# Patient Record
Sex: Male | Born: 1990 | Race: Black or African American | Hispanic: No | Marital: Single | Smoking: Current every day smoker
Health system: Southern US, Community
[De-identification: ages and names within clinical notes are randomized; demographics above are authoritative.]

## PROBLEM LIST (undated history)

## (undated) DIAGNOSIS — G43909 Migraine, unspecified, not intractable, without status migrainosus: Secondary | ICD-10-CM

## (undated) HISTORY — PX: NO PAST SURGERIES: SHX2092

---

## 2015-02-17 ENCOUNTER — Emergency Department
Admission: EM | Admit: 2015-02-17 | Discharge: 2015-02-17 | Disposition: A | Discharge status: Home / Self Care | Payer: Self-pay | Attending: Emergency Medicine | Admitting: Emergency Medicine

## 2015-02-17 ENCOUNTER — Encounter: Payer: Self-pay | Admitting: Emergency Medicine

## 2015-02-17 DIAGNOSIS — H109 Unspecified conjunctivitis: Secondary | ICD-10-CM | POA: Insufficient documentation

## 2015-02-17 DIAGNOSIS — F17210 Nicotine dependence, cigarettes, uncomplicated: Secondary | ICD-10-CM | POA: Insufficient documentation

## 2015-02-17 MED ORDER — CIPROFLOXACIN HCL 0.3 % OP SOLN: 1.0000 [drp] | OPHTHALMIC | Status: AC

## 2015-02-17 MED ORDER — CIPROFLOXACIN HCL 0.3 % OP SOLN: 1.0000 [drp] | OPHTHALMIC | Status: DC

## 2015-02-17 NOTE — ED Provider Notes (Signed)
[facility] Emergency Department Provider Note ____________________________________________  Time seen: Approximately 1:11 PM  I have reviewed the triage vital signs and the nursing notes.   HISTORY  Chief Complaint Eye Pain   HPI Chase Young is a 23 y.o. male who presents to the emergency department for evaluation of right eye irritation and pain. He reports that the symptoms started last night. He awakened this morning and the right eye was matted shut. He has severe light sensitivity today with a watery drainage and mucus in the lower lid. No known exposure to conjunctivitis.He denies injury.   History reviewed. No pertinent past medical history.  There are no active problems to display for this patient.   History reviewed. No pertinent past surgical history.  Current Outpatient Rx  Name  Route  Sig  Dispense  Refill  . ciprofloxacin (CILOXAN) 0.3 % ophthalmic solution   Right Eye   Place 1 drop into the right eye every 2 (two) hours. Administer 1 drop, every 2 hours, while awake, for 2 days. Then 1 drop, every 4 hours, while awake, for the next 5 days.   5 mL   0     Allergies Review of patient's allergies indicates no known allergies.  No family history on file.  Social History Social History  Substance Use Topics  . Smoking status: Current Every Day Smoker -- 0.50 packs/day    Types: Cigarettes  . Smokeless tobacco: None  . Alcohol Use: 0.6 oz/week    1 Cans of beer per week    Review of Systems   Constitutional: No fever/chills Eyes: Visual changes: Difficult to determine due to photosensitivity.. ENT: No sore throat. Cardiovascular: Denies chest pain. Respiratory: Denies shortness of breath. Gastrointestinal: No abdominal pain.  No nausea, no vomiting.  No diarrhea.  No constipation. Musculoskeletal: Negative for pain. Skin: Negative for rash. Neurological: Negative for headaches, focal weakness or  numbness. Psychiatric:At baseline, no complaint Lymphatic:Swollen nodes-- no Allergic: Seasonal allergies: no 10-point ROS otherwise negative.  ____________________________________________  PHYSICAL EXAM:  VITAL SIGNS: ED Triage Vitals  Enc Vitals Group     BP 02/17/15 1227 135/107 mmHg     Pulse Rate 02/17/15 1227 73     Resp 02/17/15 1227 18     Temp 02/17/15 1227 97.3 F (36.3 C)     Temp Source 02/17/15 1227 Oral     SpO2 02/17/15 1227 98 %     Weight 02/17/15 1227 160 lb (72.576 kg)     Height 02/17/15 1227  (1.676 m)     Head Cir --      Peak Flow --      Pain Score 02/17/15 1228 7     Pain Loc --      Pain Edu? --      Excl. in GC? --     Constitutional: Alert and oriented. Well appearing and in no acute distress. Eyes: Visual acuity--see nursing documentation; No globe trauma; Eyelids normal to inspection; Everted for exam no; Conjunctiva and sclera: Erythematous and injected; Corneas: Normal on  Non-stained exam; Examined with fluorescein no; EOM's intact; Pupils PERRLA; Anterior Chambers normal with limited exam.  Head: Atraumatic. Nose: No congestion/rhinnorhea. Mouth/Throat: Mucous membranes are moist.  Oropharynx non-erythematous. Neck: No stridor.  Cardiovascular: Normal rate, regular rhythm. Grossly normal heart sounds.  Good peripheral circulation. Respiratory: Normal respiratory effort.  No retractions. Gastrointestinal: Soft and nontender. No distention. No abdominal bruits. No CVA tenderness. Musculoskeletal:Normal ROM Neurologic:  Normal speech and  language. No gross focal neurologic deficits are appreciated. Speech is normal. No gait instability. Skin:  Skin is warm, dry and intact. No rash noted. Psychiatric: Mood and affect are normal. Speech and behavior are normal.  ____________________________________________   LABS (all labs ordered are listed, but only abnormal results are displayed)  Labs Reviewed - No data to  display ____________________________________________  EKG   ____________________________________________  RADIOLOGY  Not indicated ____________________________________________   PROCEDURES  Procedure(s) performed: None  ____________________________________________   INITIAL IMPRESSION / ASSESSMENT AND PLAN / ED COURSE  Pertinent labs & imaging results that were available during my care of the patient were reviewed by me and considered in my medical decision making (see chart for details).   Patient was advised to follow up with ophthalmology for symptoms that are not improving over the next 2 days. He was  also advised to return to the ER for symptoms that change or worsen if unable to schedule an appointment.  ____________________________________________   FINAL CLINICAL IMPRESSION(S) / ED DIAGNOSES  Final diagnoses:  Conjunctivitis of right eye       Chinita PesterCari B Suzie Vandam, FNP 02/17/15 1326  Sharyn CreamerMark Quale, MD 02/17/15 [phone]

## 2015-02-17 NOTE — Discharge Instructions (Signed)

## 2015-02-17 NOTE — ED Notes (Signed)
Patient to ED with R eye pain, redness, tearing & sensitivity to light.  Patient reports thick mucous coming out of eye and crustiness.  Pt in no other signs of distress.

## 2015-05-20 ENCOUNTER — Emergency Department: Payer: Self-pay

## 2015-05-20 ENCOUNTER — Emergency Department
Admission: EM | Admit: 2015-05-20 | Discharge: 2015-05-20 | Disposition: A | Discharge status: Home / Self Care | Payer: Self-pay | Attending: Emergency Medicine | Admitting: Emergency Medicine

## 2015-05-20 ENCOUNTER — Encounter: Payer: Self-pay | Admitting: Emergency Medicine

## 2015-05-20 DIAGNOSIS — G8921 Chronic pain due to trauma: Secondary | ICD-10-CM | POA: Insufficient documentation

## 2015-05-20 DIAGNOSIS — M722 Plantar fascial fibromatosis: Secondary | ICD-10-CM | POA: Insufficient documentation

## 2015-05-20 DIAGNOSIS — Y9289 Other specified places as the place of occurrence of the external cause: Secondary | ICD-10-CM | POA: Insufficient documentation

## 2015-05-20 DIAGNOSIS — S3991XA Unspecified injury of abdomen, initial encounter: Secondary | ICD-10-CM | POA: Insufficient documentation

## 2015-05-20 DIAGNOSIS — M79641 Pain in right hand: Secondary | ICD-10-CM | POA: Insufficient documentation

## 2015-05-20 DIAGNOSIS — Q6650 Congenital pes planus, unspecified foot: Secondary | ICD-10-CM | POA: Insufficient documentation

## 2015-05-20 DIAGNOSIS — F17210 Nicotine dependence, cigarettes, uncomplicated: Secondary | ICD-10-CM | POA: Insufficient documentation

## 2015-05-20 DIAGNOSIS — Y9389 Activity, other specified: Secondary | ICD-10-CM | POA: Insufficient documentation

## 2015-05-20 DIAGNOSIS — X501XXA Overexertion from prolonged static or awkward postures, initial encounter: Secondary | ICD-10-CM | POA: Insufficient documentation

## 2015-05-20 DIAGNOSIS — S39012A Strain of muscle, fascia and tendon of lower back, initial encounter: Secondary | ICD-10-CM | POA: Insufficient documentation

## 2015-05-20 DIAGNOSIS — Y998 Other external cause status: Secondary | ICD-10-CM | POA: Insufficient documentation

## 2015-05-20 MED ORDER — NAPROXEN 500 MG PO TABS: 500.0000 mg | ORAL_TABLET | Freq: Two times a day (BID) | ORAL | Status: DC

## 2015-05-20 MED ORDER — CYCLOBENZAPRINE HCL 10 MG PO TABS: 10.0000 mg | ORAL_TABLET | Freq: Three times a day (TID) | ORAL | Status: DC | PRN

## 2015-05-20 NOTE — ED Notes (Signed)
States he developed pain to bottom of left foot last Tuesday  W/o injury  Min relief with Ibuprofen ..ambulates well no swelling.. Then noticed some muscle discomfort to right lower back yesterday

## 2015-05-20 NOTE — ED Provider Notes (Signed)
[facility] Emergency Department Provider Note  ____________________________________________  Time seen: Approximately 12:35 PM  I have reviewed the triage vital signs and the nursing notes.   HISTORY  Chief Complaint Hand Pain and Foot Pain    HPI Chase Young is a 25 y.o. male patient complaining of left plan of foot pain for 6 days. Patient also complaining of muscle discomfort and third right paraspinal area of his back. Patient also complaining of right hand pain secondary to injury 2 years ago. Patient state he never had a hand x-ray and it feels tight when flexion the third phalange. Patient denies any recent trauma, decreased sensation or function of the right hand. Patient say his right flank pain secondary to a twisting incident occurred 2 days ago. No palliative measures taken for these complaints. Patient chief complaint this is left foot and he rates this pain as a 5/10. Patient stated pain increases ambulation or prolonged standing.   History reviewed. No pertinent past medical history.  There are no active problems to display for this patient.   History reviewed. No pertinent past surgical history.  Current Outpatient Rx  Name  Route  Sig  Dispense  Refill  . cyclobenzaprine (FLEXERIL) 10 MG tablet   Oral   Take 1 tablet (10 mg total) by mouth every 8 (eight) hours as needed for muscle spasms.   15 tablet   0   . naproxen (NAPROSYN) 500 MG tablet   Oral   Take 1 tablet (500 mg total) by mouth 2 (two) times daily with a meal.   60 tablet   0     Allergies Review of patient's allergies indicates no known allergies.  No family history on file.  Social History Social History  Substance Use Topics  . Smoking status: Current Every Day Smoker -- 0.50 packs/day    Types: Cigarettes  . Smokeless tobacco: None  . Alcohol Use: 0.6 oz/week    1 Cans of beer per week    Review of Systems Constitutional: No fever/chills Eyes: No  visual changes. ENT: No sore throat. Cardiovascular: Denies chest pain. Respiratory: Denies shortness of breath. Gastrointestinal: No abdominal pain.  No nausea, no vomiting.  No diarrhea.  No constipation. Genitourinary: Negative for dysuria. Musculoskeletal: Negative for back pain. Skin: Negative for rash. Neurological: Negative for headaches, focal weakness or numbness.    ____________________________________________   PHYSICAL EXAM:  VITAL SIGNS: ED Triage Vitals  Enc Vitals Group     BP 05/20/15 1150 133/63 mmHg     Pulse Rate 05/20/15 1150 83     Resp 05/20/15 1150 16     Temp 05/20/15 1150 98.4 F (36.9 C)     Temp Source 05/20/15 1150 Oral     SpO2 05/20/15 1150 99 %     Weight 05/20/15 1150 172 lb (78.019 kg)     Height 05/20/15 1150 5\' 6"  (1.676 m)     Head Cir --      Peak Flow --      Pain Score 05/20/15 1146 5     Pain Loc --      Pain Edu? --      Excl. in GC? --     Constitutional: Alert and oriented. Well appearing and in no acute distress. Eyes: Conjunctivae are normal. PERRL. EOMI. Head: Atraumatic. Nose: No congestion/rhinnorhea. Mouth/Throat: Mucous membranes are moist.  Oropharynx non-erythematous. Neck: No stridor.  No cervical spine tenderness to palpation. Hematological/Lymphatic/Immunilogical: No cervical lymphadenopathy. Cardiovascular: Normal rate,  regular rhythm. Grossly normal heart sounds.  Good peripheral circulation. Respiratory: Normal respiratory effort.  No retractions. Lungs CTAB. Gastrointestinal: Soft and nontender. No distention. No abdominal bruits. No CVA tenderness. Musculoskeletal: No lower extremity tenderness nor edema.  No joint effusions. Bilateral pes planus Neurologic:  Normal speech and language. No gross focal neurologic deficits are appreciated. No gait instability. Skin:  Skin is warm, dry and intact. No rash noted. Psychiatric: Mood and affect are normal. Speech and behavior are  normal.  ____________________________________________   LABS (all labs ordered are listed, but only abnormal results are displayed)  Labs Reviewed - No data to display ____________________________________________  EKG   ____________________________________________  RADIOLOGY  No acute findings on x-ray ____________________________________________   PROCEDURES  Procedure(s) performed: None  Critical Care performed: No  ____________________________________________   INITIAL IMPRESSION / ASSESSMENT AND PLAN / ED COURSE  Pertinent labs & imaging results that were available during my care of the patient were reviewed by me and considered in my medical decision making (see chart for details).  Fasciitis secondary to pes planus. Discussed x-ray findings with patient. Advised patient to follow-up with podiatry for definitive treatment. Patient given a prescription for naproxen. Second diagnosis right paraspinal muscles sprain. Patient given a prescription for Flexeril. ____________________________________________   FINAL CLINICAL IMPRESSION(S) / ED DIAGNOSES  Final diagnoses:  Plantar fasciitis of left foot  Pes planus, congenital, unspecified laterality  Strain of lumbar paraspinal muscle, initial encounter      Joni Reining, PA-C 05/20/15 1357  Rockne Menghini, MD 05/20/15 1535

## 2015-05-20 NOTE — ED Notes (Signed)
C/o right hand pain and left foot pain and right lower back pain.  States it has been going on for a long while.  NAD.

## 2015-05-23 ENCOUNTER — Emergency Department
Admission: EM | Admit: 2015-05-23 | Discharge: 2015-05-23 | Disposition: A | Discharge status: Home / Self Care | Payer: Self-pay | Attending: Emergency Medicine | Admitting: Emergency Medicine

## 2015-05-23 ENCOUNTER — Encounter: Payer: Self-pay | Admitting: Emergency Medicine

## 2015-05-23 DIAGNOSIS — M722 Plantar fascial fibromatosis: Secondary | ICD-10-CM | POA: Insufficient documentation

## 2015-05-23 DIAGNOSIS — Z791 Long term (current) use of non-steroidal anti-inflammatories (NSAID): Secondary | ICD-10-CM | POA: Insufficient documentation

## 2015-05-23 DIAGNOSIS — F17210 Nicotine dependence, cigarettes, uncomplicated: Secondary | ICD-10-CM | POA: Insufficient documentation

## 2015-05-23 NOTE — ED Notes (Signed)
Presents with pain to arch of left foot   Denies recent injury  Ambulates well to treatment toom

## 2015-05-23 NOTE — ED Notes (Addendum)
States he was seen for same about 3 days ago.  States he wants to be rechecked   conts to have pain. Presents with ace wrap to foot  Ambulates well to treatment room

## 2015-05-23 NOTE — Discharge Instructions (Signed)
Plantar Fasciitis Plantar fasciitis is a painful foot condition that affects the heel. It occurs when the band of tissue that connects the toes to the heel bone (plantar fascia) becomes irritated. This can happen after exercising too much or doing other repetitive activities (overuse injury). The pain from plantar fasciitis can range from mild irritation to severe pain that makes it difficult for you to walk or move. The pain is usually worse in the morning or after you have been sitting or lying down for a while. CAUSES This condition may be caused by:  Standing for long periods of time.  Wearing shoes that do not fit.  Doing high-impact activities, including running, aerobics, and ballet.  Being overweight.  Having an abnormal way of walking (gait).  Having tight calf muscles.  Having high arches in your feet.  Starting a new athletic activity. SYMPTOMS The main symptom of this condition is heel pain. Other symptoms include:  Pain that gets worse after activity or exercise.  Pain that is worse in the morning or after resting.  Pain that goes away after you walk for a few minutes. DIAGNOSIS This condition may be diagnosed based on your signs and symptoms. Your health care provider will also do a physical exam to check for:  A tender area on the bottom of your foot.  A high arch in your foot.  Pain when you move your foot.  Difficulty moving your foot. You may also need to have imaging studies to confirm the diagnosis. These can include:  X-rays.  Ultrasound.  MRI. TREATMENT  Treatment for plantar fasciitis depends on the severity of the condition. Your treatment may include:  Rest, ice, and over-the-counter pain medicines to manage your pain.  Exercises to stretch your calves and your plantar fascia.  A splint that holds your foot in a stretched, upward position while you sleep (night splint).  Physical therapy to relieve symptoms and prevent problems in the  future.  Cortisone injections to relieve severe pain.  Extracorporeal shock wave therapy (ESWT) to stimulate damaged plantar fascia with electrical impulses. It is often used as a last resort before surgery.  Surgery, if other treatments have not worked after 12 months. HOME CARE INSTRUCTIONS  Take medicines only as directed by your health care provider.  Avoid activities that cause pain.  Roll the bottom of your foot over a bag of ice or a bottle of cold water. Do this for 20 minutes, 3-4 times a day.  Perform simple stretches as directed by your health care provider.  Try wearing athletic shoes with air-sole or gel-sole cushions or soft shoe inserts.  Wear a night splint while sleeping, if directed by your health care provider.  Keep all follow-up appointments with your health care provider. PREVENTION   Do not perform exercises or activities that cause heel pain.  Consider finding low-impact activities if you continue to have problems.  Lose weight if you need to. The best way to prevent plantar fasciitis is to avoid the activities that aggravate your plantar fascia. SEEK MEDICAL CARE IF:  Your symptoms do not go away after treatment with home care measures.  Your pain gets worse.  Your pain affects your ability to move or do your daily activities.   This information is not intended to replace advice given to you by your health care provider. Make sure you discuss any questions you have with your health care provider.   Document Released: 11/25/2000 Document Revised: 11/21/2014 Document Reviewed: 01/10/2014 Elsevier   Interactive Patient Education 2016 [employer]  

## 2015-05-23 NOTE — ED Provider Notes (Signed)
[facility] Emergency Department Provider Note  ____________________________________________  Time seen: Approximately 9:51 AM  I have reviewed the triage vital signs and the nursing notes.   HISTORY  Chief Complaint Foot Pain    HPI Chase Young is a 25 y.o. male , NAD, presents to the emergency department with continued left foot arch pain. Was seen in this emergency department 3 days ago for the same. Had negative x-rays and workup and was diagnosed with plantar fasciitis. States the pain is improving with medications prescribed as well as utilizing ice to the affected area. States he presents today to gain a work release note. Has had no injuries, traumas, falls. Denies any redness, swelling, warmth to the area.   History reviewed. No pertinent past medical history.  There are no active problems to display for this patient.   History reviewed. No pertinent past surgical history.  Current Outpatient Rx  Name  Route  Sig  Dispense  Refill  . cyclobenzaprine (FLEXERIL) 10 MG tablet   Oral   Take 1 tablet (10 mg total) by mouth every 8 (eight) hours as needed for muscle spasms.   15 tablet   0   . naproxen (NAPROSYN) 500 MG tablet   Oral   Take 1 tablet (500 mg total) by mouth 2 (two) times daily with a meal.   60 tablet   0     Allergies Review of patient's allergies indicates no known allergies.  No family history on file.  Social History Social History  Substance Use Topics  . Smoking status: Current Every Day Smoker -- 0.50 packs/day    Types: Cigarettes  . Smokeless tobacco: None  . Alcohol Use: 0.6 oz/week    1 Cans of beer per week     Review of Systems  Constitutional: No fever/chills Cardiovascular: No chest pain. Respiratory:  No shortness of breath. Musculoskeletal: Positive left foot arch pain.  Skin: Negative for rash, redness, swelling, skin sores. Neurological: Negative for headaches, focal weakness or  numbness. 10-point ROS otherwise negative.  ____________________________________________   PHYSICAL EXAM:  VITAL SIGNS: ED Triage Vitals  Enc Vitals Group     BP 05/23/15 0918 125/61 mmHg     Pulse Rate 05/23/15 0918 78     Resp 05/23/15 0918 20     Temp 05/23/15 0918 98.7 F (37.1 C)     Temp Source 05/23/15 0918 Oral     SpO2 05/23/15 0918 99 %     Weight 05/23/15 0918 169 lb (76.658 kg)     Height 05/23/15 0918 5\' 6"  (1.676 m)     Head Cir --      Peak Flow --      Pain Score --      Pain Loc --      Pain Edu? --      Excl. in GC? --     Constitutional: Alert and oriented. Well appearing and in no acute distress. Eyes: Conjunctivae are normal.  Head: Atraumatic. Cardiovascular: Good peripheral circulation. Respiratory: Normal respiratory effort without tachypnea or retractions.  Musculoskeletal: Tenderness about the plantar fascia of the left foot. Patient notes improvement since last visit. No lower extremity edema.  No joint effusions. Neurologic:  Normal speech and language. No gross focal neurologic deficits are appreciated.  Skin:  Skin is warm, dry and intact. No rash noted. Psychiatric: Mood and affect are normal. Speech and behavior are normal. Patient exhibits appropriate insight and judgement.   ____________________________________________   Vickie EpleyLABS  None  ____________________________________________  EKG  None ____________________________________________  RADIOLOGY  None  ____________________________________________    PROCEDURES  Procedure(s) performed: None    Medications - No data to display   ____________________________________________   INITIAL IMPRESSION / ASSESSMENT AND PLAN / ED COURSE  Patient's diagnosis is consistent with plantar fasciitis of the left foot. Patient advised to continue Naprosyn as previously prescribed. Continue ice to the affected area. Also advised patient to seek new insoles for his work boots to  provide better or support. Work note given to release patient back to work without restrictions as of Sunday, March 12 per the patient's request. Patient is to follow up with [facility] if symptoms persist past this treatment course. Patient is given ED precautions to return to the ED for any worsening or new symptoms.   ____________________________________________  FINAL CLINICAL IMPRESSION(S) / ED DIAGNOSES  Final diagnoses:  Plantar fasciitis of left foot      NEW MEDICATIONS STARTED DURING THIS VISIT:  New Prescriptions   No medications on file         Hope Pigeon, PA-C 05/23/15 1012  Governor Rooks, MD 05/23/15 1331

## 2015-11-06 ENCOUNTER — Encounter: Payer: Self-pay | Admitting: Medical Oncology

## 2015-11-06 ENCOUNTER — Emergency Department
Admission: EM | Admit: 2015-11-06 | Discharge: 2015-11-06 | Disposition: A | Discharge status: Home / Self Care | Payer: Self-pay | Attending: Student in an Organized Health Care Education/Training Program | Admitting: Student in an Organized Health Care Education/Training Program

## 2015-11-06 DIAGNOSIS — J029 Acute pharyngitis, unspecified: Secondary | ICD-10-CM | POA: Insufficient documentation

## 2015-11-06 DIAGNOSIS — Z791 Long term (current) use of non-steroidal anti-inflammatories (NSAID): Secondary | ICD-10-CM | POA: Insufficient documentation

## 2015-11-06 DIAGNOSIS — F17210 Nicotine dependence, cigarettes, uncomplicated: Secondary | ICD-10-CM | POA: Insufficient documentation

## 2015-11-06 LAB — POCT RAPID STREP A: Streptococcus, Group A Screen (Direct): NEGATIVE

## 2015-11-06 MED ORDER — MAGIC MOUTHWASH: ORAL | 0 refills | Status: DC

## 2015-11-06 MED ORDER — MAGIC MOUTHWASH: 5.0000 mL | Freq: Four times a day (QID) | ORAL | 0 refills | Status: DC

## 2015-11-06 NOTE — ED Provider Notes (Signed)
[facility] Emergency Department Provider Note  ____________________________________________   First MD Initiated Contact with Patient 11/06/15 1944     (approximate)  I have reviewed the triage vital signs and the nursing notes.   HISTORY  Chief Complaint Sore Throat   HPI Lyda PeroneMarcus D Feng is a 25 y.o. male serous complaint of sore throat that began yesterday.Patient states that he has had a fever and has been taking home remedies to reduce his fever without much relief. Patient has not actually taken his temperature. He states pain is increased with swallowing and talking. Patient denies any ear pain, nausea or vomiting. Currently he rates his pain as a 7 out of 10.    History reviewed. No pertinent past medical history.  There are no active problems to display for this patient.   History reviewed. No pertinent surgical history.  Prior to Admission medications   Medication Sig Start Date End Date Taking? Authorizing Provider  cyclobenzaprine (FLEXERIL) 10 MG tablet Take 1 tablet (10 mg total) by mouth every 8 (eight) hours as needed for muscle spasms. 05/20/15   Joni Reiningonald K Smith, PA-C  magic mouthwash SOLN Benadryl, alum & mag, nystation  5ml ac and hs, swish and swallow 11/06/15   Tommi Rumpshonda L Summers, PA-C  naproxen (NAPROSYN) 500 MG tablet Take 1 tablet (500 mg total) by mouth 2 (two) times daily with a meal. 05/20/15 05/19/16  Joni Reiningonald K Smith, PA-C    Allergies Review of patient's allergies indicates no known allergies.  No family history on file.  Social History Social History  Substance Use Topics  . Smoking status: Current Every Day Smoker    Packs/day: 0.50    Types: Cigarettes  . Smokeless tobacco: Not on file  . Alcohol use 0.6 oz/week    1 Cans of beer per week    Review of Systems Constitutional: No fever/chills Eyes: No visual changes. ENT: Positive sore throat. Cardiovascular: Denies chest pain. Respiratory: Denies shortness of  breath. Gastrointestinal:   No nausea, no vomiting.  Skin: Negative for rash. Neurological: Negative for headaches, focal weakness or numbness.  10-point ROS otherwise negative.  ____________________________________________   PHYSICAL EXAM:  VITAL SIGNS: ED Triage Vitals  Enc Vitals Group     BP 11/06/15 1857 129/65     Pulse Rate 11/06/15 1857 92     Resp 11/06/15 1857 16     Temp 11/06/15 1857 97.8 F (36.6 C)     Temp Source 11/06/15 1857 Oral     SpO2 11/06/15 1857 99 %     Weight 11/06/15 1852 150 lb (68 kg)     Height 11/06/15 1852 5\' 6"  (1.676 m)     Head Circumference --      Peak Flow --      Pain Score 11/06/15 1852 7     Pain Loc --      Pain Edu? --      Excl. in GC? --     Constitutional: Alert and oriented. Well appearing and in no acute distress. Eyes: Conjunctivae are normal. PERRL. EOMI. Head: Atraumatic. Nose: No congestion/rhinnorhea. Mouth/Throat: Mucous membranes are moist.  Oropharynx erythematous Without exudate. Neck: No stridor.   Hematological/Lymphatic/Immunilogical: Mild bilateral cervical lymphadenopathy. Cardiovascular: Normal rate, regular rhythm. Grossly normal heart sounds.  Good peripheral circulation. Respiratory: Normal respiratory effort.  No retractions. Lungs CTAB. Musculoskeletal: Moves upper and lower extremities without any difficulty. Normal gait was noted. Neurologic:  Normal speech and language. No gross focal neurologic deficits are  appreciated. No gait instability. Skin:  Skin is warm, dry and intact. No rash noted. Psychiatric: Mood and affect are normal. Speech and behavior are normal.  ____________________________________________   LABS (all labs ordered are listed, but only abnormal results are displayed)  Labs Reviewed  CULTURE, GROUP A STREP [facility]  POCT RAPID STREP A      PROCEDURES  Procedure(s) performed: None  Procedures  Critical Care performed:  No  ____________________________________________   INITIAL IMPRESSION / ASSESSMENT AND PLAN / ED COURSE  Pertinent labs & imaging results that were available during my care of the patient were reviewed by me and considered in my medical decision making (see chart for details).   Clinical Course   Patient was given information that his strep test was negative. He was given a prescription for Magic mouthwash and to take Tylenol as needed for throat pain. He is follow-up with [facility] if any continued problems with his throat.  ____________________________________________   FINAL CLINICAL IMPRESSION(S) / ED DIAGNOSES  Final diagnoses:  Viral pharyngitis      NEW MEDICATIONS STARTED DURING THIS VISIT:  Discharge Medication List as of 11/06/2015  8:21 PM    START taking these medications   Details  magic mouthwash SOLN Take 5 mLs by mouth 4 (four) times daily., Starting Wed 11/06/2015, Print         Note:  This document was prepared using Dragon voice recognition software and may include unintentional dictation errors.    Tommi RumpsRhonda L Summers, PA-C 11/06/15 2127    Willy EddyPatrick Robinson, MD 11/06/15 [phone]

## 2015-11-06 NOTE — Discharge Instructions (Signed)
Use Magic mouthwash as directed. Tylenol as needed for throat pain. Follow-up with [facility] if any continued problems with your throat.

## 2015-11-06 NOTE — ED Triage Notes (Signed)
Pt c/o sore throat since yesterday 

## 2015-11-06 NOTE — ED Notes (Signed)
POC rapid strepA negative.

## 2015-11-09 LAB — CULTURE, GROUP A STREP (THRC)

## 2015-11-28 ENCOUNTER — Encounter: Payer: Self-pay | Admitting: Emergency Medicine

## 2015-11-28 ENCOUNTER — Emergency Department
Admission: EM | Admit: 2015-11-28 | Discharge: 2015-11-28 | Disposition: A | Discharge status: Home / Self Care | Payer: Self-pay | Attending: Emergency Medicine | Admitting: Emergency Medicine

## 2015-11-28 ENCOUNTER — Emergency Department: Payer: Self-pay

## 2015-11-28 DIAGNOSIS — E869 Volume depletion, unspecified: Secondary | ICD-10-CM | POA: Insufficient documentation

## 2015-11-28 DIAGNOSIS — Z87891 Personal history of nicotine dependence: Secondary | ICD-10-CM | POA: Insufficient documentation

## 2015-11-28 DIAGNOSIS — Z791 Long term (current) use of non-steroidal anti-inflammatories (NSAID): Secondary | ICD-10-CM | POA: Insufficient documentation

## 2015-11-28 DIAGNOSIS — R42 Dizziness and giddiness: Secondary | ICD-10-CM | POA: Insufficient documentation

## 2015-11-28 LAB — BASIC METABOLIC PANEL
ANION GAP: 7 (ref 5–15)
BUN: 12 mg/dL (ref 6–20)
CO2: 27 mmol/L (ref 22–32)
Calcium: 9.1 mg/dL (ref 8.9–10.3)
Chloride: 106 mmol/L (ref 101–111)
Creatinine, Ser: 1.25 mg/dL — ABNORMAL HIGH (ref 0.61–1.24)
Glucose, Bld: 99 mg/dL (ref 65–99)
POTASSIUM: 3.4 mmol/L — AB (ref 3.5–5.1)
SODIUM: 140 mmol/L (ref 135–145)

## 2015-11-28 LAB — CBC WITH DIFFERENTIAL/PLATELET
BASOS ABS: 0 10*3/uL (ref 0–0.1)
Basophils Relative: 1 %
EOS PCT: 2 %
Eosinophils Absolute: 0.1 10*3/uL (ref 0–0.7)
HCT: 41.5 % (ref 40.0–52.0)
Hemoglobin: 14.4 g/dL (ref 13.0–18.0)
LYMPHS ABS: 2.3 10*3/uL (ref 1.0–3.6)
LYMPHS PCT: 40 %
MCH: 30.1 pg (ref 26.0–34.0)
MCHC: 34.7 g/dL (ref 32.0–36.0)
MCV: 86.8 fL (ref 80.0–100.0)
Monocytes Absolute: 0.6 10*3/uL (ref 0.2–1.0)
Monocytes Relative: 10 %
NEUTROS ABS: 2.8 10*3/uL (ref 1.4–6.5)
Neutrophils Relative %: 47 %
PLATELETS: 227 10*3/uL (ref 150–440)
RBC: 4.78 MIL/uL (ref 4.40–5.90)
RDW: 13.8 % (ref 11.5–14.5)
WBC: 5.8 10*3/uL (ref 3.8–10.6)

## 2015-11-28 NOTE — ED Provider Notes (Signed)
[facility] Emergency Department Provider Note  ____________________________________________   First MD Initiated Contact with Patient 11/28/15 2131     (approximate)  I have reviewed the triage vital signs and the nursing notes.   HISTORY  Chief Complaint Dizziness and Shortness of Breath    HPI Chase Young is a 25 y.o. male who presents for evaluation of episodic lightheadedness, some mild shortness of breath, and generalized fatigue it is been gradual onset over the last several days.  He reports that he works very hard and has had maybe only 9 hours sleep total of this whole week.  His lightheadedness and dizziness occurs mostly at work.  He works in an area with a lot of fumes and he has to use a respirator and he also feels lightheaded when he does not use the respirator.  He has had a mildheadache at times but that is completely resolved.  Occasionally feels short of breath but that is resolved as well.  He has had no episodes of passing out.  He denies chest pain, nausea, vomiting, abdominal pain.  He states that he rarely eats a full meal and snacks regularly instead of eating at regular intervals.  He tries to stay hydrated but is not sure he has been doing so.    Overall he describes symptoms as moderate, better with rest and worse with exertion.  He does not have a primary care doctor so he just thought he should get checked out.   History reviewed. No pertinent past medical history.  There are no active problems to display for this patient.   History reviewed. No pertinent surgical history.  Prior to Admission medications   Medication Sig Start Date End Date Taking? Authorizing Provider  cyclobenzaprine (FLEXERIL) 10 MG tablet Take 1 tablet (10 mg total) by mouth every 8 (eight) hours as needed for muscle spasms. 05/20/15   Joni Reining, PA-C  magic mouthwash SOLN Benadryl, alum & mag, nystation  5ml ac and hs, swish and swallow 11/06/15    Tommi Rumps, PA-C  naproxen (NAPROSYN) 500 MG tablet Take 1 tablet (500 mg total) by mouth 2 (two) times daily with a meal. 05/20/15 05/19/16  Joni Reining, PA-C    Allergies Review of patient's allergies indicates no known allergies.  No family history on file.  Social History Social History  Substance Use Topics  . Smoking status: Former Smoker    Packs/day: 0.50    Types: Cigarettes    Quit date: 10/15/2015  . Smokeless tobacco: Never Used  . Alcohol use 0.6 oz/week    1 Cans of beer per week    Review of Systems Constitutional: No fever/chills Eyes: No visual changes. ENT: No sore throat. Cardiovascular: Denies chest pain. Respiratory: Denies shortness of breath. Gastrointestinal: No abdominal pain.  No nausea, no vomiting.  No diarrhea.  No constipation. Genitourinary: Negative for dysuria. Musculoskeletal: Negative for back pain. Skin: Negative for rash. Neurological: Negative for headaches, focal weakness or numbness.  10-point ROS otherwise negative.  ____________________________________________   PHYSICAL EXAM:  VITAL SIGNS: ED Triage Vitals  Enc Vitals Group     BP 11/28/15 1735 120/76     Pulse Rate 11/28/15 1735 72     Resp 11/28/15 1735 18     Temp 11/28/15 1735 98.2 F (36.8 C)     Temp Source 11/28/15 1735 Oral     SpO2 11/28/15 1735 99 %     Weight 11/28/15 1736 155 lb (  70.3 kg)     Height 11/28/15 1736 5\' 6"  (1.676 m)     Head Circumference --      Peak Flow --      Pain Score 11/28/15 1736 4     Pain Loc --      Pain Edu? --      Excl. in GC? --     Constitutional: Alert and oriented. Well appearing and in no acute distress. Eyes: Conjunctivae are normal. PERRL. EOMI. Head: Atraumatic. Nose: No congestion/rhinnorhea. Mouth/Throat: Mucous membranes are moist.  Oropharynx non-erythematous. Neck: No stridor.  No meningeal signs.   Cardiovascular: Normal rate, regular rhythm. Good peripheral circulation. Grossly normal heart  sounds. Respiratory: Normal respiratory effort.  No retractions. Lungs CTAB. Gastrointestinal: Soft and nontender. No distention.  Musculoskeletal: No lower extremity tenderness nor edema. No gross deformities of extremities. Neurologic:  Normal speech and language. No gross focal neurologic deficits are appreciated.  Skin:  Skin is warm, dry and intact. No rash noted. Psychiatric: Mood and affect are normal. Speech and behavior are normal.  ____________________________________________   LABS (all labs ordered are listed, but only abnormal results are displayed)  Labs Reviewed  BASIC METABOLIC PANEL - Abnormal; Notable for the following:       Result Value   Potassium 3.4 (*)    Creatinine, Ser 1.25 (*)    All other components within normal limits  CBC WITH DIFFERENTIAL/PLATELET   ____________________________________________  EKG  ED ECG REPORT I, Adryan Shin, the attending physician, personally viewed and interpreted this ECG.  Date: 11/28/2015 EKG Time: 17:47 Rate: 66 Rhythm: normal sinus rhythm with sinus arrhythmia QRS Axis: normal Intervals: normal ST/T Wave abnormalities: Mild anterior ST segment elevation consistent with early repolarization Conduction Disturbances: none Narrative Interpretation: unremarkable  ____________________________________________  RADIOLOGY   Dg Chest 2 View  Result Date: 11/28/2015 CLINICAL DATA:  Shortness of breath.  Cough since yesterday EXAM: CHEST  2 VIEW COMPARISON:  None. FINDINGS: The cardiomediastinal contours are normal. The lungs are clear. Pulmonary vasculature is normal. No consolidation, pleural effusion, or pneumothorax. No acute osseous abnormalities are seen. IMPRESSION: No acute pulmonary process. Electronically Signed   By: Rubye Oaks M.D.   On: 11/28/2015 18:21    ____________________________________________   PROCEDURES  Procedure(s) performed:   Procedures   Critical Care performed:  No ____________________________________________   INITIAL IMPRESSION / ASSESSMENT AND PLAN / ED COURSE  Pertinent labs & imaging results that were available during my care of the patient were reviewed by me and considered in my medical decision making (see chart for details).  Patient says his symptoms are most consistent with mild volume depletion.  His creatinine is 1.2 but his GFR is normal and he is drinking Powerade in the room.  I advised him to try to follow up with primary care doctor, eat and drink regularly and plan clear fluids, but explained that there does not seem to be an acute or emergent cause of his symptoms.  No evidence of Brugada or WPW on his EKG and he has had no syncopal episodes.  Overall his exam is reassuring and I think is appropriate for outpatient follow-up.  I gave my usual and customary return precautions.      ____________________________________________  FINAL CLINICAL IMPRESSION(S) / ED DIAGNOSES  Final diagnoses:  Episodic lightheadedness  Volume depletion     MEDICATIONS GIVEN DURING THIS VISIT:  Medications - No data to display   NEW OUTPATIENT MEDICATIONS STARTED DURING THIS VISIT:  New Prescriptions  No medications on file    Modified Medications   No medications on file    Discontinued Medications   No medications on file     Note:  This document was prepared using Dragon voice recognition software and may include unintentional dictation errors.    Loleta Roseory Wilhelm Ganaway, MD 11/28/15 2205

## 2015-11-28 NOTE — ED Notes (Addendum)
Pt reports mid-sternal and left rib pain that started yesterday. Pt states it went away for a little while and he "felt better" but the pain returned today and decided to come in for evaluation. 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 Pt reports (+) nausea, but denies vomiting.

## 2015-11-28 NOTE — Discharge Instructions (Signed)
You have been seen today in the Emergency Department (ED).  Your workup including labs and EKG show reassuring results.  Your symptoms may be due to dehydration, so it is important that you drink plenty of non-alcoholic fluids.  Please call your regular doctor as soon as possible to schedule the next available clinic appointment to follow up with him/her regarding your visit to the ED and your symptoms.  Return to the Emergency Department (ED)  if you have any further syncopal episodes (pass out again) or develop ANY chest pain, pressure, tightness, trouble breathing, sudden sweating, or other symptoms that concern you.

## 2015-11-28 NOTE — ED Triage Notes (Signed)
Pt states left work yesterday and had a hard time breathing, and became dizzy/drowsy. Pt states woke up last night diaphoretic with difficulty breathing and again this morning. 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 Pt states at this time he feels drowsy, is ambulatory without difficulty at this time, is alert and oriented at this time.

## 2015-12-26 ENCOUNTER — Encounter (HOSPITAL_COMMUNITY): Payer: Self-pay | Admitting: Emergency Medicine

## 2015-12-26 DIAGNOSIS — Y990 Civilian activity done for income or pay: Secondary | ICD-10-CM | POA: Insufficient documentation

## 2015-12-26 DIAGNOSIS — X58XXXA Exposure to other specified factors, initial encounter: Secondary | ICD-10-CM | POA: Insufficient documentation

## 2015-12-26 DIAGNOSIS — Z87891 Personal history of nicotine dependence: Secondary | ICD-10-CM | POA: Insufficient documentation

## 2015-12-26 DIAGNOSIS — R42 Dizziness and giddiness: Secondary | ICD-10-CM | POA: Insufficient documentation

## 2015-12-26 DIAGNOSIS — S161XXA Strain of muscle, fascia and tendon at neck level, initial encounter: Secondary | ICD-10-CM | POA: Insufficient documentation

## 2015-12-26 DIAGNOSIS — Y9389 Activity, other specified: Secondary | ICD-10-CM | POA: Insufficient documentation

## 2015-12-26 DIAGNOSIS — Y9289 Other specified places as the place of occurrence of the external cause: Secondary | ICD-10-CM | POA: Insufficient documentation

## 2015-12-26 LAB — CBC WITH DIFFERENTIAL/PLATELET
BASOS PCT: 1 %
Basophils Absolute: 0 10*3/uL (ref 0.0–0.1)
EOS ABS: 0.3 10*3/uL (ref 0.0–0.7)
EOS PCT: 5 %
HCT: 42.3 % (ref 39.0–52.0)
Hemoglobin: 14 g/dL (ref 13.0–17.0)
Lymphocytes Relative: 50 %
Lymphs Abs: 2.8 10*3/uL (ref 0.7–4.0)
MCH: 29.4 pg (ref 26.0–34.0)
MCHC: 33.1 g/dL (ref 30.0–36.0)
MCV: 88.7 fL (ref 78.0–100.0)
MONO ABS: 0.5 10*3/uL (ref 0.1–1.0)
MONOS PCT: 10 %
Neutro Abs: 1.8 10*3/uL (ref 1.7–7.7)
Neutrophils Relative %: 34 %
Platelets: 231 10*3/uL (ref 150–400)
RBC: 4.77 MIL/uL (ref 4.22–5.81)
RDW: 14.4 % (ref 11.5–15.5)
WBC: 5.4 10*3/uL (ref 4.0–10.5)

## 2015-12-26 LAB — COMPREHENSIVE METABOLIC PANEL
ALBUMIN: 3.7 g/dL (ref 3.5–5.0)
ALT: 38 U/L (ref 17–63)
ANION GAP: 9 (ref 5–15)
AST: 76 U/L — ABNORMAL HIGH (ref 15–41)
Alkaline Phosphatase: 57 U/L (ref 38–126)
BILIRUBIN TOTAL: 0.5 mg/dL (ref 0.3–1.2)
BUN: 12 mg/dL (ref 6–20)
CO2: 22 mmol/L (ref 22–32)
Calcium: 9 mg/dL (ref 8.9–10.3)
Chloride: 107 mmol/L (ref 101–111)
Creatinine, Ser: 1.15 mg/dL (ref 0.61–1.24)
GFR calc Af Amer: >60 mL/min (ref >60)
GFR calc non Af Amer: >60 mL/min (ref >60)
GLUCOSE: 98 mg/dL (ref 65–99)
POTASSIUM: 3.5 mmol/L (ref 3.5–5.1)
SODIUM: 138 mmol/L (ref 135–145)
TOTAL PROTEIN: 7 g/dL (ref 6.5–8.1)

## 2015-12-26 NOTE — ED Triage Notes (Signed)
Pt. reports feeling lightheaded , drowsy " almost passed out" / fatigue at work yesterday , pt. added muscle strain at posterior neck while working out last week .

## 2015-12-27 ENCOUNTER — Emergency Department (HOSPITAL_COMMUNITY)
Admission: EM | Admit: 2015-12-27 | Discharge: 2015-12-27 | Disposition: A | Discharge status: Home / Self Care | Payer: Self-pay | Attending: Emergency Medicine | Admitting: Emergency Medicine

## 2015-12-27 DIAGNOSIS — R42 Dizziness and giddiness: Secondary | ICD-10-CM

## 2015-12-27 DIAGNOSIS — S161XXA Strain of muscle, fascia and tendon at neck level, initial encounter: Secondary | ICD-10-CM

## 2015-12-27 LAB — I-STAT TROPONIN, ED: TROPONIN I, POC: 0.01 ng/mL (ref 0.00–0.08)

## 2015-12-27 LAB — D-DIMER, QUANTITATIVE: D-Dimer, Quant: 0.48 ug/mL-FEU (ref 0.00–0.50)

## 2015-12-27 MED ORDER — SODIUM CHLORIDE 0.9 % IV BOLUS (SEPSIS)
1000.0000 mL | Freq: Once | INTRAVENOUS | Status: AC
  Administered 2015-12-27: 1000 mL via INTRAVENOUS

## 2015-12-27 MED ORDER — KETOROLAC TROMETHAMINE 30 MG/ML IJ SOLN
30.0000 mg | Freq: Once | INTRAMUSCULAR | Status: AC
  Administered 2015-12-27: 30 mg via INTRAVENOUS
  Filled 2015-12-27: qty 1

## 2015-12-27 MED ORDER — IBUPROFEN 800 MG PO TABS: 800.0000 mg | ORAL_TABLET | Freq: Three times a day (TID) | ORAL | 0 refills | Status: DC | PRN

## 2015-12-27 NOTE — ED Notes (Signed)
MD at bedside. 

## 2015-12-27 NOTE — ED Notes (Signed)
Patient able to ambulate independently  

## 2015-12-27 NOTE — Discharge Instructions (Signed)
I recommend you increase your water intake. Please avoid caffeine and alcohol as these may make you more dehydrated. I recommended you use your respirator at all times when at work when exposed to fumes, chemicals. Please schedule appointment for close follow-up with a primary care physician. Your labs, EKG, vital signs today were normal.   To find a primary care or specialty doctor please call [phone] or [phone] to access "[facility] Find a Doctor Service."  You may also go on the [facility] website at [email or web address]  There are also multiple [facility] practices throughout the [place] that are frequently accepting new patients. You may find a clinic that is close to your home and contact them.  [facility] -  [address] [place] [phone]  [facility] in [place] (also locations in [place] and [place]) -  [address] [place] [address] [phone]  [facility] -  [address] [place] [address] [phone]

## 2015-12-27 NOTE — ED Provider Notes (Signed)
By signing my name below, I, Majel Homereyton Lee, attest that this documentation has been prepared under the direction and in the presence of Marija Calamari N Elisheba Mcdonnell, DO . Electronically Signed: Majel HomerPeyton Lee, Scribe. 12/27/2015. 1:02 AM.  TIME SEEN: 12:56 AM  CHIEF COMPLAINT: Lightheadedness   HPI:   Chase Young is a 25 y.o. male who presents to the Emergency Department for an evaluation s/p "blacking out" while at work yesterday. Pt reports he did not fall or lose consciousness during this episode but felt as if he was unaware of his surroundings and had to "come to" for several seconds. He states associated headache, shortness of breath, drowsiness and lightheadedness today. He denies a headache now in the ED but states he has been short of breath. Pt notes he began a new job 7 months ago and is exposed to "harsh" chemicals daily. He is not sure what these chemicals are but states he does have to wear a respirator because of the fumes. He believes his symptoms may be due to this as well as increased stress and lack of sleep recently. Pt also complains of posterior neck pain that began last week after working out. He believes he may have a muscle strain. He states he has not taken any medication to relieve his pain. Pt denies fever, chills, nausea, vomiting, cough and hx of PE in his lungs or legs. Pt reports he drove to [place] 1 month ago in which he was in the car for ~7 hours without stopping. [facility] on September 14 for the same. At that time had a negative workup including normal labs, chest x-ray and EKG that showed early repolarization.  ROS: See HPI Constitutional: no fever  Eyes: no drainage  ENT: no runny nose   Cardiovascular:  no chest pain  Resp: SOB  GI: no vomiting GU: no dysuria Integumentary: no rash  Allergy: no hives  Musculoskeletal: no leg swelling  Neurological: no slurred speech ROS otherwise negative  PAST MEDICAL HISTORY/PAST  SURGICAL HISTORY:  History reviewed. No pertinent past medical history.  MEDICATIONS:  Prior to Admission medications   Medication Sig Start Date End Date Taking? Authorizing Provider  cyclobenzaprine (FLEXERIL) 10 MG tablet Take 1 tablet (10 mg total) by mouth every 8 (eight) hours as needed for muscle spasms. 05/20/15   Joni Reiningonald K Smith, PA-C  magic mouthwash SOLN Benadryl, alum & mag, nystation  5ml ac and hs, swish and swallow 11/06/15   Tommi Rumpshonda L Summers, PA-C  naproxen (NAPROSYN) 500 MG tablet Take 1 tablet (500 mg total) by mouth 2 (two) times daily with a meal. 05/20/15 05/19/16  Joni Reiningonald K Smith, PA-C    ALLERGIES:  No Known Allergies  SOCIAL HISTORY:  Social History  Substance Use Topics  . Smoking status: Former Smoker    Packs/day: 0.50    Types: Cigarettes    Quit date: 10/15/2015  . Smokeless tobacco: Never Used  . Alcohol use Yes    FAMILY HISTORY: No family history on file.  EXAM: BP 119/79 (BP Location: Right Arm)   Pulse (!) 54   Temp 97.5 F (36.4 C)   Resp 18   SpO2 100%  CONSTITUTIONAL: Alert and oriented and responds appropriately to questions. Well-appearing; well-nourished HEAD: Normocephalic EYES: Conjunctivae clear, PERRL ENT: normal nose; no rhinorrhea; moist mucous membranes NECK: Supple, no meningismus, no LAD; no midline spinal tenderness or step-off or deformity, tender to palpation over the left trapezius muscle with associated muscle spasming  CARD: RRR; S1 and S2 appreciated; no murmurs, no clicks, no rubs, no gallops RESP: Normal chest excursion without splinting or tachypnea; breath sounds clear and equal bilaterally; no wheezes, no rhonchi, no rales, no hypoxia or respiratory distress, speaking full sentences ABD/GI: Normal bowel sounds; non-distended; soft, non-tender, no rebound, no guarding, no peritoneal signs BACK:  The back appears normal and is non-tender to palpation, there is no CVA tenderness EXT: Normal ROM in all joints; non-tender to  palpation; no edema; normal capillary refill; no cyanosis, no calf tenderness or swelling    SKIN: Normal color for age and race; warm; no rash NEURO: Moves all extremities equally, sensation to light touch intact diffusely, cranial nerves II through XII intact, strength 5/5 in all 4 extremities, ambulates with normal gait PSYCH: The patient's mood and manner are appropriate. Grooming and personal hygiene are appropriate.  MEDICAL DECISION MAKING: Patient here with complaints of intermittent headaches, drowsiness, shortness of breath and lightheadedness. Also complaining of posterior neck pain after working out.  Recently had negative workup at [facility] emergency department. EKG today shows early repolarization with no delta wave, WPW, arrhythmia, block. Doubt ACS, dissection. Does have risk factors for PE but risk is low. We'll obtain d-dimer. Labs ordered in triage are unremarkable. We'll check orthostatic vital signs. We'll treat his symptoms with Toradol and IV fluids.  ED PROGRESS: Troponin negative, d-dimer negative. He is not orthostatic. Reports feeling much better after IV fluids and Toradol. I feel patient is safe to be discharged home with close outpatient follow-up. Have advised him to use his respirator when exposed to chemicals at all times at work and discuss symptoms with his management as he is not sure of what chemicals he is exposed to. No sign of any life-threatening illness at this time. As for his neck pain a suspect this is muscle strain. Will discharge with prescription for ibuprofen given Toradol helps his pain the emergency. No midline tenderness or other injury that suggests he needs imaging of his neck. He is neurologically intact. Patient is comfortable with this plan.   At this time, I do not feel there is any life-threatening condition present. I have reviewed and discussed all results (EKG, imaging, lab, urine as appropriate), exam findings with patient/family. I  have reviewed nursing notes and appropriate previous records.  I feel the patient is safe to be discharged home without further emergent workup and can continue workup as an outpatient as needed. Discussed usual and customary return precautions. Patient/family verbalize understanding and are comfortable with this plan.  Outpatient follow-up has been provided. All questions have been answered.    EKG Interpretation  Date/Time:  Friday December 27 2015 01:12:31 EDT Ventricular Rate:  53 PR Interval:    QRS Duration: 94 QT Interval:  429 QTC Calculation: 403 R Axis:   68 Text Interpretation:  Sinus rhythm RSR' in V1 or V2, probably normal variant LVH by voltage ST elev, probable normal early repol pattern No significant change since last tracing Confirmed by Hilari Wethington,  DO, Lindsie Simar [phone]) on 12/27/2015 2:31:54 AM       I personally performed the services described in this documentation, which was scribed in my presence. The recorded information has been reviewed and is accurate.     Layla Maw Gwendy Boeder, DO 12/27/15 [phone]

## 2016-01-01 ENCOUNTER — Ambulatory Visit
Admission: EM | Admit: 2016-01-01 | Discharge: 2016-01-01 | Disposition: A | Discharge status: Home / Self Care | Payer: Worker's Compensation | Attending: Family Medicine | Admitting: Family Medicine

## 2016-01-01 DIAGNOSIS — Z0189 Encounter for other specified special examinations: Secondary | ICD-10-CM

## 2016-01-01 DIAGNOSIS — H18011 Anterior corneal pigmentations, right eye: Secondary | ICD-10-CM

## 2016-01-01 DIAGNOSIS — S0501XA Injury of conjunctiva and corneal abrasion without foreign body, right eye, initial encounter: Secondary | ICD-10-CM

## 2016-01-01 DIAGNOSIS — H5711 Ocular pain, right eye: Secondary | ICD-10-CM

## 2016-01-01 DIAGNOSIS — T1591XA Foreign body on external eye, part unspecified, right eye, initial encounter: Secondary | ICD-10-CM

## 2016-01-01 MED ORDER — GENTAMICIN SULFATE 0.3 % OP SOLN: 1.0000 [drp] | Freq: Four times a day (QID) | OPHTHALMIC | 0 refills | Status: DC

## 2016-01-01 MED ORDER — HYDROCODONE-ACETAMINOPHEN 5-325 MG PO TABS: 1.0000 | ORAL_TABLET | Freq: Three times a day (TID) | ORAL | 0 refills | Status: DC | PRN

## 2016-01-01 NOTE — ED Provider Notes (Addendum)
[facility]    CSN: [id number] Arrival date & time: 01/01/16  0931     History   Chief Complaint Chief Complaint  Patient presents with  . Eye Pain    HPI Chase Young is a 25 y.o. male.   Patient's 25 year old black male reports getting something in his eye. He tells me that he thinks he got something in his eye last night and that it started bothering him. he states that the right eye has continued to bother him through the night he thought he could see something black on the corner of his right eye as well but could not get it out he mentioned at work this morning that he was having pain in his right eye they sent him here to be evaluated for workers comp injury. Not clear why they thought this was a workers comp injury unless they got a different history from the patient and I did that this started last night while he was at home. He denies previous history of eye injury or eye problem. No known drug allergies. No previous surgeries no chronic medical problems. No pertinent family medical history relevant to today's visit. He does smoke and does drink alcohol.    The history is provided by the patient. No language interpreter was used.  Eye Pain  This is a new problem. The current episode started yesterday. The problem occurs constantly. The problem has not changed since onset.Pertinent negatives include no chest pain, no abdominal pain, no headaches and no shortness of breath. Nothing aggravates the symptoms. Nothing relieves the symptoms. He has tried nothing for the symptoms. The treatment provided no relief.    History reviewed. No pertinent past medical history.  There are no active problems to display for this patient.   Past Surgical History:  Procedure Laterality Date  . NO PAST SURGERIES         Home Medications    Prior to Admission medications   Medication Sig Start Date End Date Taking? Authorizing Provider  gentamicin (GARAMYCIN) 0.3 %  ophthalmic solution Place 1 drop into the right eye 4 (four) times daily. 01/01/16   Hassan Rowan, MD  HYDROcodone-acetaminophen (NORCO) 5-325 MG tablet Take 1 tablet by mouth every 8 (eight) hours as needed for moderate pain or severe pain. Do not operate heavy machinery within 8 hrs of taking medication 01/01/16   Hassan Rowan, MD  ibuprofen (ADVIL,MOTRIN) 800 MG tablet Take 1 tablet (800 mg total) by mouth every 8 (eight) hours as needed for mild pain. 12/27/15   Layla Maw Ward, DO    Family History History reviewed. No pertinent family history.  Social History Social History  Substance Use Topics  . Smoking status: Current Every Day Smoker    Packs/day: 0.20    Types: Cigarettes    Last attempt to quit: 10/15/2015  . Smokeless tobacco: Never Used  . Alcohol use Yes     Comment: occasionally     Allergies   Review of patient's allergies indicates no known allergies.   Review of Systems Review of Systems  Eyes: Positive for pain, discharge, redness and itching.  Respiratory: Negative for shortness of breath.   Cardiovascular: Negative for chest pain.  Gastrointestinal: Negative for abdominal pain.  Neurological: Negative for headaches.  All other systems reviewed and are negative.    Physical Exam Triage Vital Signs ED Triage Vitals  Enc Vitals Group     BP 01/01/16 0959 (!) 124/94     Pulse  Rate 01/01/16 0959 79     Resp 01/01/16 0959 16     Temp 01/01/16 0959 97.6 F (36.4 C)     Temp Source 01/01/16 0959 Oral     SpO2 01/01/16 0959 100 %     Weight 01/01/16 0957 145 lb (65.8 kg)     Height 01/01/16 0957 5\' 6"  (1.676 m)     Head Circumference --      Peak Flow --      Pain Score 01/01/16 1002 0     Pain Loc --      Pain Edu? --      Excl. in GC? --    No data found.   Updated Vital Signs BP (!) 124/94 (BP Location: Left Arm)   Pulse 79   Temp 97.6 F (36.4 C) (Oral)   Resp 16   Ht 5\' 6"  (1.676 m)   Wt 145 lb (65.8 kg)   SpO2 100%   BMI 23.40 kg/m     Visual Acuity Right Eye Distance: 20/25 Left Eye Distance: 20/30 Bilateral Distance:    Right Eye Near:   Left Eye Near:    Bilateral Near:     Physical Exam  Constitutional: He is oriented to person, place, and time. He appears well-developed and well-nourished.  HENT:  Head: Normocephalic and atraumatic.  Eyes: Right eye exhibits discharge.  Neck: Normal range of motion.  Pulmonary/Chest: Breath sounds normal.  Musculoskeletal: Normal range of motion.  Neurological: He is alert and oriented to person, place, and time.  Skin: Skin is warm.  Psychiatric: He has a normal mood and affect.  Vitals reviewed.    UC Treatments / Results  Labs (all labs ordered are listed, but only abnormal results are displayed) Labs Reviewed - No data to display  EKG  EKG Interpretation None       Radiology No results found.  Procedures .Foreign Body Removal Date/Time: 01/01/2016 11:15 AM Performed by: Hassan Rowan Authorized by: Hassan Rowan  Consent: Verbal consent obtained. Body area: eye Location details: right conjunctiva  Anesthesia: Local Anesthetic: tetracaine drops  Sedation: Patient sedated: no Patient restrained: no Localization method: eyelid eversion and magnification Removal mechanism: 25-gauge needle Eye examined with fluorescein. Fluorescein uptake. Corneal abrasion size: small Corneal abrasion location: medial No residual rust ring present. Dressing: antibiotic drops Depth: embedded Complexity: complex 0 objects recovered. Objects recovered: 0 Post-procedure assessment: foreign body not removed Patient tolerance: Patient tolerated the procedure well with no immediate complications Comments: Concern over this hyperpigmented area in the left medial axillary aspect of the right eye exhibits a pigmented area I was embedded foreign object was sent to eye DR. for evaluation   (including critical care time)  Medications Ordered in UC Medications - No  data to display   Initial Impression / Assessment and Plan / UC Course  I have reviewed the triage vital signs and the nursing notes.  Pertinent labs & imaging results that were available during my care of the patient were reviewed by me and considered in my medical decision making (see chart for details).  Clinical Course   Patient tolerated procedure well after I was stained and eyelid flip temp was made to remove the pigmented area: I. This was not able to be done even with a thin needle on tuberculin syringe at this time concerned this may area on his right eye which may mean other implications. Will send to the eye doctor to be evaluated this evening. Place on gentamicin eyedrops  2 drops in the right eye 3 times a day for the next 4-5 days also pain medication Vicodin one by mouth every 8 hours when necessary work form filled states he can go back to work tomorrow without restrictions. If not better in 48 hours will need to see his eye doctor. Since cannot recover an object from his eye was irrigated as well.  Final Clinical Impressions(s) / UC Diagnoses   Final diagnoses:  Abrasion of right cornea, initial encounter  Pain of right eye  Foreign body of right eye, initial encounter  Corneal pigmentation of right eye    New Prescriptions New Prescriptions   GENTAMICIN (GARAMYCIN) 0.3 % OPHTHALMIC SOLUTION    Place 1 drop into the right eye 4 (four) times daily.   HYDROCODONE-ACETAMINOPHEN (NORCO) 5-325 MG TABLET    Take 1 tablet by mouth every 8 (eight) hours as needed for moderate pain or severe pain. Do not operate heavy machinery within 8 hrs of taking medication     Should be noted that patient's was checked at the [facility] reporting site and no signs of problems with narcotics noted after being reviewed. Hassan RowanEugene Pax Reasoner, MD 01/01/16 1117    Hassan RowanEugene Ashyla Luth, MD 01/01/16 [phone]

## 2016-01-01 NOTE — ED Notes (Signed)
Urine Drug screen obtained along with breath alcohol in clinic today during visit.

## 2016-01-01 NOTE — ED Triage Notes (Signed)
Patient complains of right eye pain. Patient states that he was moving some stuff at home last night and states that this morning when he was at work he noticed some black in his eye, with redness. Patient states that he feels like something is in his eye and is unsure if this occurred at home or at work. Patient states that work sent him here for evaluation.

## 2016-01-04 ENCOUNTER — Telehealth: Payer: Self-pay

## 2016-01-04 NOTE — Telephone Encounter (Signed)
Courtesy call back completed today after patients visit at [facility]. Patient improved and will follow up with their PCP if symptoms continue or worsen.   

## 2016-03-31 ENCOUNTER — Emergency Department (HOSPITAL_COMMUNITY): Payer: No Typology Code available for payment source

## 2016-03-31 ENCOUNTER — Emergency Department (HOSPITAL_COMMUNITY)
Admission: EM | Admit: 2016-03-31 | Discharge: 2016-03-31 | Disposition: A | Discharge status: Home / Self Care | Payer: No Typology Code available for payment source | Attending: Emergency Medicine | Admitting: Emergency Medicine

## 2016-03-31 ENCOUNTER — Encounter (HOSPITAL_COMMUNITY): Payer: Self-pay

## 2016-03-31 DIAGNOSIS — F17210 Nicotine dependence, cigarettes, uncomplicated: Secondary | ICD-10-CM | POA: Diagnosis not present

## 2016-03-31 DIAGNOSIS — Y9389 Activity, other specified: Secondary | ICD-10-CM | POA: Insufficient documentation

## 2016-03-31 DIAGNOSIS — Y999 Unspecified external cause status: Secondary | ICD-10-CM | POA: Diagnosis not present

## 2016-03-31 DIAGNOSIS — S161XXA Strain of muscle, fascia and tendon at neck level, initial encounter: Secondary | ICD-10-CM | POA: Diagnosis not present

## 2016-03-31 DIAGNOSIS — S199XXA Unspecified injury of neck, initial encounter: Secondary | ICD-10-CM | POA: Diagnosis present

## 2016-03-31 DIAGNOSIS — Y92410 Unspecified street and highway as the place of occurrence of the external cause: Secondary | ICD-10-CM | POA: Insufficient documentation

## 2016-03-31 MED ORDER — CYCLOBENZAPRINE HCL 10 MG PO TABS: 10.0000 mg | ORAL_TABLET | Freq: Two times a day (BID) | ORAL | 0 refills | Status: DC | PRN

## 2016-03-31 MED ORDER — DICLOFENAC SODIUM 50 MG PO TBEC: 50.0000 mg | DELAYED_RELEASE_TABLET | Freq: Two times a day (BID) | ORAL | 0 refills | Status: DC

## 2016-03-31 NOTE — Discharge Instructions (Signed)
Do not drive while taking the muscle relaxant as it can make you sleepy °
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
## 2016-03-31 NOTE — ED Notes (Signed)
Declined W/C at D/C and was escorted to lobby by RN. 

## 2016-05-05 ENCOUNTER — Emergency Department (HOSPITAL_BASED_OUTPATIENT_CLINIC_OR_DEPARTMENT_OTHER)
Admit: 2016-05-05 | Discharge: 2016-05-05 | Disposition: A | Discharge status: Home / Self Care | Payer: No Typology Code available for payment source

## 2016-05-05 ENCOUNTER — Emergency Department (HOSPITAL_COMMUNITY)
Admission: EM | Admit: 2016-05-05 | Discharge: 2016-05-05 | Disposition: A | Discharge status: Home / Self Care | Payer: No Typology Code available for payment source | Attending: Emergency Medicine | Admitting: Emergency Medicine

## 2016-05-05 ENCOUNTER — Encounter (HOSPITAL_COMMUNITY): Payer: Self-pay | Admitting: Emergency Medicine

## 2016-05-05 DIAGNOSIS — M7989 Other specified soft tissue disorders: Secondary | ICD-10-CM

## 2016-05-05 DIAGNOSIS — F17210 Nicotine dependence, cigarettes, uncomplicated: Secondary | ICD-10-CM | POA: Diagnosis not present

## 2016-05-05 DIAGNOSIS — M79602 Pain in left arm: Secondary | ICD-10-CM

## 2016-05-05 DIAGNOSIS — Z7982 Long term (current) use of aspirin: Secondary | ICD-10-CM | POA: Diagnosis not present

## 2016-05-05 DIAGNOSIS — M79609 Pain in unspecified limb: Secondary | ICD-10-CM

## 2016-05-05 MED ORDER — METHOCARBAMOL 500 MG PO TABS: 500.0000 mg | ORAL_TABLET | Freq: Two times a day (BID) | ORAL | 0 refills | Status: DC

## 2016-05-05 MED ORDER — IBUPROFEN 800 MG PO TABS: 800.0000 mg | ORAL_TABLET | Freq: Three times a day (TID) | ORAL | 0 refills | Status: DC

## 2016-05-05 NOTE — Progress Notes (Signed)
*  PRELIMINARY RESULTS* Vascular Ultrasound Left upper extremity venous duplex has been completed.  Preliminary findings: the visualized veins of the left upper extremity are negative for deep and superficial vein thrombosis. Nothing seen in area of pain.  Chase FischerCharlotte C Rayelle Armor 05/05/2016, 11:36 AM
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
## 2016-05-05 NOTE — ED Notes (Signed)
Pt in venous doppler study
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
## 2016-08-06 ENCOUNTER — Ambulatory Visit
Admission: EM | Admit: 2016-08-06 | Discharge: 2016-08-06 | Disposition: A | Discharge status: Home / Self Care | Payer: Worker's Compensation | Attending: Family Medicine | Admitting: Family Medicine

## 2016-08-06 ENCOUNTER — Encounter: Payer: Self-pay | Admitting: *Deleted

## 2016-08-06 DIAGNOSIS — Z23 Encounter for immunization: Secondary | ICD-10-CM

## 2016-08-06 DIAGNOSIS — S51812A Laceration without foreign body of left forearm, initial encounter: Secondary | ICD-10-CM

## 2016-08-06 DIAGNOSIS — W268XXA Contact with other sharp object(s), not elsewhere classified, initial encounter: Secondary | ICD-10-CM | POA: Diagnosis not present

## 2016-08-06 HISTORY — DX: Migraine, unspecified, not intractable, without status migrainosus: G43.909

## 2016-08-06 MED ORDER — MUPIROCIN CALCIUM 2 % EX CREA: 1.0000 "application " | TOPICAL_CREAM | Freq: Two times a day (BID) | CUTANEOUS | 0 refills | Status: AC

## 2016-08-06 MED ORDER — TETANUS-DIPHTH-ACELL PERTUSSIS 5-2.5-18.5 LF-MCG/0.5 IM SUSP
0.5000 mL | Freq: Once | INTRAMUSCULAR | Status: AC
  Administered 2016-08-06: 0.5 mL via INTRAMUSCULAR
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
## 2016-08-06 NOTE — ED Provider Notes (Signed)
[facility]    CSN: [id number] Arrival date & time: 08/06/16  1031     History   Chief Complaint Chief Complaint  Patient presents with  . Laceration    HPI Chase Young is a 26 y.o. male.   Chase Young 26 year old black male here because of laceration to the left forearm. States that he was at work lifting metal apparently this panel or a bunch of medical that was too heavy and as he was tied up with down on the ground and raise his arm up his left arm grazed the metal cutting his left arm and his right leg. Fortunately on the right thigh just the pants and is no break to the skin but on the left forearm he does have a laceration about 6 cm. He does smoke no previous past medical surgeries he has a history of migraine. No previous surgeries no pertinent family medical history relevant to today's visit. No known drug allergies   The history is provided by the patient.  Laceration  Location:  Shoulder/arm Shoulder/arm laceration location:  L forearm Length:  7 Depth:  Through dermis Quality: straight   Bleeding: controlled   Time since incident:  2 hours Laceration mechanism:  Metal edge Pain details:    Quality:  Aching   Severity:  Moderate   Progression:  Worsening Foreign body present:  Metal Worsened by:  Nothing Tetanus status:  Unknown   Past Medical History:  Diagnosis Date  . Migraine     There are no active problems to display for this patient.   Past Surgical History:  Procedure Laterality Date  . NO PAST SURGERIES         Home Medications    Prior to Admission medications   Medication Sig Start Date End Date Taking? Authorizing Provider  acetaminophen (TYLENOL) 325 MG tablet Take 650 mg by mouth every 6 (six) hours as needed for mild pain.   Yes [provider]  ibuprofen (ADVIL,MOTRIN) 800 MG tablet Take 1 tablet (800 mg total) by mouth 3 (three) times daily. 05/05/16  Yes Roxy Horseman, PA-C    Aspirin-Acetaminophen-Caffeine (GOODY HEADACHE PO) Take 1 packet by mouth daily as needed (headache).    [provider]  diclofenac (VOLTAREN) 50 MG EC tablet Take 1 tablet (50 mg total) by mouth 2 (two) times daily. Patient not taking: Reported on 05/05/2016 03/31/16   Janne Napoleon, NP  gentamicin (GARAMYCIN) 0.3 % ophthalmic solution Place 1 drop into the right eye 4 (four) times daily. Patient not taking: Reported on 05/05/2016 01/01/16   Hassan Rowan, MD  HYDROcodone-acetaminophen [facility]) 5-325 MG tablet Take 1 tablet by mouth every 8 (eight) hours as needed for moderate pain or severe pain. Do not operate heavy machinery within 8 hrs of taking medication Patient not taking: Reported on 05/05/2016 01/01/16   Hassan Rowan, MD  methocarbamol (ROBAXIN) 500 MG tablet Take 1 tablet (500 mg total) by mouth 2 (two) times daily. 05/05/16   Roxy Horseman, PA-C  mupirocin cream (BACTROBAN) 2 % Apply 1 application topically 2 (two) times daily. 08/06/16   Hassan Rowan, MD    Family History History reviewed. No pertinent family history.  Social History Social History  Substance Use Topics  . Smoking status: Current Every Day Smoker    Packs/day: 0.20    Types: Cigarettes    Last attempt to quit: 10/15/2015  . Smokeless tobacco: Never Used  . Alcohol use Yes     Comment: occasionally  Allergies   Patient has no known allergies.   Review of Systems Review of Systems  All other systems reviewed and are negative.    Physical Exam Triage Vital Signs ED Triage Vitals  Enc Vitals Group     BP 08/06/16 1104 132/86     Pulse Rate 08/06/16 1104 (!) 58     Resp 08/06/16 1104 16     Temp 08/06/16 1104 98.3 F (36.8 C)     Temp Source 08/06/16 1104 Oral     SpO2 08/06/16 1104 100 %     Weight 08/06/16 1106 156 lb (70.8 kg)     Height 08/06/16 1106 5\' 6"  (1.676 m)     Head Circumference --      Peak Flow --      Pain Score --      Pain Loc --      Pain Edu? --      Excl.  in GC? --    No data found.   Updated Vital Signs BP 132/86 (BP Location: Left Arm)   Pulse (!) 58   Temp 98.3 F (36.8 C) (Oral)   Resp 16   Ht 5\' 6"  (1.676 m)   Wt 156 lb (70.8 kg)   SpO2 100%   BMI 25.18 kg/m   Visual Acuity Right Eye Distance:   Left Eye Distance:   Bilateral Distance:    Right Eye Near:   Left Eye Near:    Bilateral Near:     Physical Exam  Constitutional: He is oriented to person, place, and time. He appears well-developed and well-nourished.  HENT:  Head: Normocephalic and atraumatic.  Eyes: Pupils are equal, round, and reactive to light.  Neck: Normal range of motion. Neck supple.  Pulmonary/Chest: Effort normal.  Musculoskeletal: He exhibits tenderness.       Left forearm: He exhibits laceration.       Arms: Patient is a laceration of left forearm about 6 cm in length  Neurological: He is alert and oriented to person, place, and time. No cranial nerve deficit.  Skin: Skin is warm.  Psychiatric: He has a normal mood and affect.  Vitals reviewed.    UC Treatments / Results  Labs (all labs ordered are listed, but only abnormal results are displayed) Labs Reviewed - No data to display  EKG  EKG Interpretation None       Radiology No results found.  Procedures .Marland KitchenLaceration Repair Date/Time: 08/06/2016 12:10 PM Performed by: Hassan Rowan Authorized by: Hassan Rowan   Consent:    Consent obtained:  Verbal   Consent given by:  Parent Anesthesia (see MAR for exact dosages):    Anesthesia method:  Local infiltration   Local anesthetic:  Lidocaine 1% w/o epi Laceration details:    Location:  Shoulder/arm   Shoulder/arm location:  L lower arm   Length (cm):  6 Repair type:    Repair type:  Simple Pre-procedure details:    Preparation:  Patient was prepped and draped in usual sterile fashion Exploration:    Hemostasis achieved with:  Direct pressure   Wound extent: no foreign bodies/material noted, no nerve damage noted and  no vascular damage noted     Contaminated: no   Treatment:    Area cleansed with:  Shur-Clens and Betadine   Amount of cleaning:  Standard   Visualized foreign bodies/material removed: no   Skin repair:    Repair method:  Staples Approximation:    Approximation:  Close   [employer]  border: well-aligned   Post-procedure details:    Dressing:  Sterile dressing   Patient tolerance of procedure:  Tolerated well, no immediate complications Comments:     6 staples were placed in the laceration of the left forearm. Patient tolerated procedure well. No problems. Will have patient return in 10-14 days for staple removal   (including critical care time)  Medications Ordered in UC Medications  Tdap (BOOSTRIX) injection 0.5 mL (0.5 mLs Intramuscular Given 08/06/16 1122)     Initial Impression / Assessment and Plan / UC Course  I have reviewed the triage vital signs and the nursing notes.  Pertinent labs & imaging results that were available during my care of the patient were reviewed by me and considered in my medical decision making (see chart for details).     Laceration left forearm that was closed using staples. Patient tolerated procedure well return in 10-12 days for staple removalBactrim and ointment will be ordered prescribed to apply to 3 times a day for the wound. Recommend he can return back to work tomorrow but no lifting with the left arm until staples are Final Clinical Impressions(s) / UC Diagnoses   Final diagnoses:  Forearm laceration, left, initial encounter    New Prescriptions New Prescriptions   MUPIROCIN CREAM (BACTROBAN) 2 %    Apply 1 application topically 2 (two) times daily.    Laceration of the left forearm suture removal in 10-14 days    Hassan RowanWade, Shavette Shoaff, MD 08/06/16 1212

## 2016-08-13 ENCOUNTER — Ambulatory Visit
Admission: EM | Admit: 2016-08-13 | Discharge: 2016-08-13 | Disposition: A | Discharge status: Home / Self Care | Payer: Worker's Compensation

## 2016-08-13 DIAGNOSIS — Z4889 Encounter for other specified surgical aftercare: Secondary | ICD-10-CM

## 2016-08-13 NOTE — ED Triage Notes (Signed)
Remove Staples

## 2016-08-13 NOTE — ED Notes (Signed)
Spoke to patient and informed him of the need to leave the staples in place at least 3-5 more days. Laceration looks well, well approximated, clean dry and intact. No signs of infection. Pt. Verbalized understanding and will return in a few days to have sutures removed.

## 2016-08-19 ENCOUNTER — Encounter: Payer: Self-pay | Admitting: *Deleted

## 2016-08-19 ENCOUNTER — Ambulatory Visit
Admission: EM | Admit: 2016-08-19 | Discharge: 2016-08-19 | Disposition: A | Discharge status: Home / Self Care | Payer: Worker's Compensation

## 2016-08-19 DIAGNOSIS — Z4802 Encounter for removal of sutures: Secondary | ICD-10-CM

## 2016-08-19 NOTE — ED Triage Notes (Signed)
6 staples removed. Laceration is well healed with no redness, drainage or swelling visible.

## 2016-11-04 ENCOUNTER — Emergency Department (HOSPITAL_COMMUNITY)
Admission: EM | Admit: 2016-11-04 | Discharge: 2016-11-04 | Disposition: A | Discharge status: Home / Self Care | Payer: 59 | Attending: Emergency Medicine | Admitting: Emergency Medicine

## 2016-11-04 ENCOUNTER — Emergency Department (HOSPITAL_COMMUNITY): Payer: 59

## 2016-11-04 ENCOUNTER — Encounter (HOSPITAL_COMMUNITY): Payer: Self-pay | Admitting: Emergency Medicine

## 2016-11-04 DIAGNOSIS — Y990 Civilian activity done for income or pay: Secondary | ICD-10-CM | POA: Diagnosis not present

## 2016-11-04 DIAGNOSIS — Y929 Unspecified place or not applicable: Secondary | ICD-10-CM | POA: Diagnosis not present

## 2016-11-04 DIAGNOSIS — W231XXA Caught, crushed, jammed, or pinched between stationary objects, initial encounter: Secondary | ICD-10-CM | POA: Insufficient documentation

## 2016-11-04 DIAGNOSIS — F17210 Nicotine dependence, cigarettes, uncomplicated: Secondary | ICD-10-CM | POA: Diagnosis not present

## 2016-11-04 DIAGNOSIS — Y9389 Activity, other specified: Secondary | ICD-10-CM | POA: Insufficient documentation

## 2016-11-04 DIAGNOSIS — S60921A Unspecified superficial injury of right hand, initial encounter: Secondary | ICD-10-CM | POA: Insufficient documentation

## 2016-11-04 DIAGNOSIS — S6991XA Unspecified injury of right wrist, hand and finger(s), initial encounter: Secondary | ICD-10-CM

## 2016-11-04 MED ORDER — HYDROCODONE-ACETAMINOPHEN 5-325 MG PO TABS
1.0000 | ORAL_TABLET | Freq: Once | ORAL | Status: AC
  Administered 2016-11-04: 1 via ORAL
  Filled 2016-11-04: qty 1

## 2016-11-04 MED ORDER — HYDROCODONE-ACETAMINOPHEN 5-325 MG PO TABS: 1.0000 | ORAL_TABLET | ORAL | 0 refills | Status: DC | PRN

## 2016-11-04 NOTE — ED Provider Notes (Signed)
[facility] Provider Note   CSN: [id number] Arrival date & time: 11/04/16  0541     History   Chief Complaint Chief Complaint  Patient presents with  . Hand Injury  . Wrist Pain    HPI Chase Young is a 26 y.o. male.  The history is provided by the patient, a significant other and medical records.  Hand Injury   The incident occurred more than 1 week ago. The incident occurred at work. The injury mechanism was a direct blow. The pain is present in the right hand. The quality of the pain is described as aching. The pain is at a severity of 7/10. The pain is moderate. The pain has been constant since the incident. Pertinent negatives include no fever and no malaise/fatigue. He reports no foreign bodies present. The symptoms are aggravated by movement, use and palpation. He has tried NSAIDs for the symptoms. The treatment provided no relief.    Past Medical History:  Diagnosis Date  . Migraine     There are no active problems to display for this patient.   Past Surgical History:  Procedure Laterality Date  . NO PAST SURGERIES         Home Medications    Prior to Admission medications   Medication Sig Start Date End Date Taking? Authorizing Provider  acetaminophen (TYLENOL) 325 MG tablet Take 650 mg by mouth every 6 (six) hours as needed for mild pain.    [provider]  Aspirin-Acetaminophen-Caffeine (GOODY HEADACHE PO) Take 1 packet by mouth daily as needed (headache).    [provider]  diclofenac (VOLTAREN) 50 MG EC tablet Take 1 tablet (50 mg total) by mouth 2 (two) times daily. Patient not taking: Reported on 05/05/2016 03/31/16   Janne Napoleon, NP  gentamicin (GARAMYCIN) 0.3 % ophthalmic solution Place 1 drop into the right eye 4 (four) times daily. Patient not taking: Reported on 05/05/2016 01/01/16   Hassan Rowan, MD  HYDROcodone-acetaminophen [facility]) 5-325 MG tablet Take 1 tablet by mouth every 8 (eight) hours as needed for moderate  pain or severe pain. Do not operate heavy machinery within 8 hrs of taking medication Patient not taking: Reported on 05/05/2016 01/01/16   Hassan Rowan, MD  ibuprofen (ADVIL,MOTRIN) 800 MG tablet Take 1 tablet (800 mg total) by mouth 3 (three) times daily. 05/05/16   Roxy Horseman, PA-C  methocarbamol (ROBAXIN) 500 MG tablet Take 1 tablet (500 mg total) by mouth 2 (two) times daily. 05/05/16   Roxy Horseman, PA-C  mupirocin cream (BACTROBAN) 2 % Apply 1 application topically 2 (two) times daily. 08/06/16   Hassan Rowan, MD    Family History No family history on file.  Social History Social History  Substance Use Topics  . Smoking status: Current Every Day Smoker    Packs/day: 0.20    Types: Cigarettes    Last attempt to quit: 10/15/2015  . Smokeless tobacco: Never Used  . Alcohol use Yes     Comment: occasionally     Allergies   Patient has no known allergies.   Review of Systems Review of Systems  Constitutional: Negative for chills, diaphoresis, fatigue, fever and malaise/fatigue.  HENT: Negative for congestion and rhinorrhea.   Eyes: Negative for visual disturbance.  Respiratory: Negative for cough, chest tightness, shortness of breath, wheezing and stridor.   Cardiovascular: Negative for chest pain and palpitations.  Gastrointestinal: Negative for abdominal pain, constipation, diarrhea, nausea and vomiting.  Genitourinary: Negative for dysuria.  Musculoskeletal: Negative for  back pain and neck pain.  Skin: Negative for rash and wound.  Neurological: Negative for weakness and headaches.  Psychiatric/Behavioral: Negative for agitation.  All other systems reviewed and are negative.    Physical Exam Updated Vital Signs BP 118/75 (BP Location: Left Arm)   Pulse 61   Temp (!) 97.5 F (36.4 C) (Oral)   Resp 18   SpO2 98%   Physical Exam  Constitutional: He is oriented to person, place, and time. He appears well-developed and well-nourished. No distress.  HENT:    Head: Normocephalic and atraumatic.  Mouth/Throat: Oropharynx is clear and moist. No oropharyngeal exudate.  Eyes: Conjunctivae are normal.  Neck: Neck supple.  Cardiovascular: Normal rate and intact distal pulses.   No murmur heard. Pulmonary/Chest: Effort normal and breath sounds normal. No stridor. No respiratory distress. He exhibits no tenderness.  Abdominal: Soft. There is no tenderness.  Musculoskeletal: He exhibits tenderness. He exhibits no edema or deformity.       Right hand: He exhibits tenderness and bony tenderness. He exhibits normal range of motion, normal capillary refill, no laceration and no swelling. Normal sensation noted. Normal strength noted.       Hands: Patient has normal sensation, pulses, and capillary refill. Patient has tenderness in the snuffbox and dorsal right hand. Normal thumb movement and opposition. Pain with grip.  Neurological: He is alert and oriented to person, place, and time. No cranial nerve deficit or sensory deficit. He exhibits normal muscle tone.  Skin: Skin is warm and dry. Capillary refill takes less than 2 seconds. He is not diaphoretic. No erythema. No pallor.  Psychiatric: He has a normal mood and affect.  Nursing note and vitals reviewed.    ED Treatments / Results  Labs (all labs ordered are listed, but only abnormal results are displayed) Labs Reviewed - No data to display  EKG  EKG Interpretation None       Radiology Dg Wrist Complete Right  Result Date: 11/04/2016 CLINICAL DATA:  Right hand and wrist pain after injury. Couch fell on right hand. EXAM: RIGHT WRIST - COMPLETE 3+ VIEW COMPARISON:  None. FINDINGS: There is no evidence of fracture or dislocation. There is no evidence of arthropathy or other focal bone abnormality. Soft tissues are unremarkable. IMPRESSION: Negative radiographs of the right wrist. Electronically Signed   By: Rubye Oaks M.D.   On: 11/04/2016 06:20   Dg Hand Complete Right  Result Date:  11/04/2016 CLINICAL DATA:  Right hand and wrist pain after injury. Couch fell on hand. EXAM: RIGHT HAND - COMPLETE 3+ VIEW COMPARISON:  None. FINDINGS: There is no evidence of fracture or dislocation. There is no evidence of arthropathy or other focal bone abnormality. Soft tissues are unremarkable. IMPRESSION: Negative radiographs of the right hand. Electronically Signed   By: Rubye Oaks M.D.   On: 11/04/2016 06:21    Procedures Procedures (including critical care time)  Medications Ordered in ED Medications  HYDROcodone-acetaminophen (NORCO/VICODIN) 5-325 MG per tablet 1 tablet (1 tablet Oral Given 11/04/16 0743)     Initial Impression / Assessment and Plan / ED Course  I have reviewed the triage vital signs and the nursing notes.  Pertinent labs & imaging results that were available during my care of the patient were reviewed by me and considered in my medical decision making (see chart for details).     Chase Young is a 26 y.o. male with no significant past medical history who is right-handed and presents with right hand  injury. 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 He reports the entirely of the couch crushed his right hand. He says he had immediate onset of pain and has had consistent pain ever since. He says the pain is continuing. His pain is nonradiating and located at the base of the thumb and hand. He reports the pain is moderate to severe as a 6-9 out of 10 in severity. He has taken ibuprofen without significant relief. He has pain with thumb movement. He has normal sensation. He denies any other injuries. No other complaints.  On exam, patient has right snuffbox tenderness. Patient also has tenderness at the dorsal right hand. Normal wrist range of motion. Normal pulses. Normal capillary refill. Patient can move all fingers with pain.  X-rays were obtained showing no evidence of acute  fracture.   Given patient's snuffbox tenderness, there is still concern for occult scaphoid fracture. Patient will be placed into a thumb spica splint and will follow-up with the hand orthopedics team.  PT discharged in good condition.    Final Clinical Impressions(s) / ED Diagnoses   Final diagnoses:  Injury of right hand, initial encounter    New Prescriptions Discharge Medication List as of 11/04/2016  7:53 AM    START taking these medications   Details  !! HYDROcodone-acetaminophen (NORCO/VICODIN) 5-325 MG tablet Take 1 tablet by mouth every 4 (four) hours as needed., Starting Wed 11/04/2016, Print     !! - Potential duplicate medications found. Please discuss with provider.     Clinical Impression: 1. Injury of right hand, initial encounter     Disposition: Discharge  Condition: Good  I have discussed the results, Dx and Tx plan with the pt(& family if present). He/she/they expressed understanding and agree(s) with the plan. Discharge instructions discussed at great length. Strict return precautions discussed and pt &/or family have verbalized understanding of the instructions. No further questions at time of discharge.    Discharge Medication List as of 11/04/2016  7:53 AM    START taking these medications   Details  !! HYDROcodone-acetaminophen (NORCO/VICODIN) 5-325 MG tablet Take 1 tablet by mouth every 4 (four) hours as needed., Starting Wed 11/04/2016, Print     !! - Potential duplicate medications found. Please discuss with provider.      Follow Up: Mack Hook, MD [address] [place] [address] [phone]     [facility] [address] [place] [address] [phone]    [facility] EMERGENCY DEPARTMENT [address] [id number] mc [place] [address] [phone]  If symptoms worsen     Tamsyn Owusu, Canary Brim, MD 11/04/16 [phone]

## 2016-11-04 NOTE — Progress Notes (Signed)
Orthopedic Tech Progress Note Patient Details:  Chase Young 05/19/1990 [id number]  Ortho Devices Type of Ortho Device: Ace wrap, Thumb spica splint Ortho Device/Splint Location: rue Ortho Device/Splint Interventions: Application   Angie Hogg 11/04/2016, 8:04 AM

## 2016-11-04 NOTE — ED Triage Notes (Signed)
Pt reports two-three weeks ago he dropped a couch on his right hand, denies bending to wrist during injury. Pt states pain to right hand, generalized and also particularly right ring finger. Pt also pointing to right wrist when describing pain. PT states he has been taking NSAIDs with no relief.

## 2016-11-04 NOTE — Discharge Instructions (Signed)
Your exam was concerning for a injury to the scaphoid bone in your hand. Please keep the splint in place and follow-up with orthopedics in the next week. Use the pain medicine to help with ear pain. If any symptoms change or worsen, please return to the nearest emergency department.

## 2016-12-14 ENCOUNTER — Encounter (HOSPITAL_COMMUNITY): Payer: Self-pay | Admitting: Emergency Medicine

## 2016-12-14 ENCOUNTER — Emergency Department (HOSPITAL_COMMUNITY)
Admission: EM | Admit: 2016-12-14 | Discharge: 2016-12-14 | Disposition: A | Discharge status: Home / Self Care | Payer: 59 | Attending: Emergency Medicine | Admitting: Emergency Medicine

## 2016-12-14 DIAGNOSIS — S3992XA Unspecified injury of lower back, initial encounter: Secondary | ICD-10-CM | POA: Diagnosis present

## 2016-12-14 DIAGNOSIS — F17210 Nicotine dependence, cigarettes, uncomplicated: Secondary | ICD-10-CM | POA: Insufficient documentation

## 2016-12-14 DIAGNOSIS — Y939 Activity, unspecified: Secondary | ICD-10-CM | POA: Insufficient documentation

## 2016-12-14 DIAGNOSIS — Y9259 Other trade areas as the place of occurrence of the external cause: Secondary | ICD-10-CM | POA: Diagnosis not present

## 2016-12-14 DIAGNOSIS — Y990 Civilian activity done for income or pay: Secondary | ICD-10-CM | POA: Diagnosis not present

## 2016-12-14 DIAGNOSIS — S39012A Strain of muscle, fascia and tendon of lower back, initial encounter: Secondary | ICD-10-CM | POA: Insufficient documentation

## 2016-12-14 DIAGNOSIS — X500XXA Overexertion from strenuous movement or load, initial encounter: Secondary | ICD-10-CM | POA: Insufficient documentation

## 2016-12-14 MED ORDER — IBUPROFEN 800 MG PO TABS: 800.0000 mg | ORAL_TABLET | Freq: Three times a day (TID) | ORAL | 0 refills | Status: DC

## 2016-12-14 MED ORDER — METHOCARBAMOL 500 MG PO TABS: 500.0000 mg | ORAL_TABLET | Freq: Two times a day (BID) | ORAL | 0 refills | Status: DC | PRN

## 2016-12-14 NOTE — Discharge Instructions (Signed)
Back Pain:   Your back pain should be treated with medicines such as ibuprofen or aleve and this back pain should get better over the next 2 weeks.  However if you develop severe or worsening pain, low back pain with fever, numbness, weakness or inability to walk or urinate, you should return to the ER immediately.  Please follow up with your doctor this week for a recheck if still having symptoms. Low back pain is discomfort in the lower back that may be due to injuries to muscles and ligaments around the spine.  Occasionally, it may be caused by a a problem to a part of the spine called a disc.  The pain may last several days or a week;  However, most patients get completely well in 4 weeks.  Self - care:  The application of heat can help soothe the pain.  Maintaining your daily activities, including walking, is encourged, as it will help you get better faster than just staying in bed.  Medications are also useful to help with pain control.  A commonly prescribed medications includes acetaminophen.  This medication is generally safe, though you should not take more than 8 of the extra strength ( ) pills a day.  Non steroidal anti inflammatory medications including Ibuprofen and naproxen;  These medications help both pain and swelling and are very useful in treating back pain.  They should be taken with food, as they can cause stomach upset, and more seriously, stomach bleeding.    Muscle relaxants:  These medications can help with muscle tightness that is a cause of lower back pain.  Most of these medications can cause drowsiness, and it is not safe to drive or use dangerous machinery while taking them.  You will need to follow up with  Your primary healthcare provider in 1-2 weeks for reassessment.

## 2016-12-14 NOTE — ED Triage Notes (Signed)
Pt to ER for evaluation of left lower back pain radiating down left leg onset two weeks ago. 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 No difficulty ambulating.

## 2016-12-14 NOTE — ED Provider Notes (Signed)
[facility]EMERGENCY DEPT Provider Note   CSN: [id number] Arrival date & time: 12/14/16  1236     History   Chief Complaint Chief Complaint  Patient presents with  . Back Pain    HPI Chase Young is a 26 y.o. male.  HPI  History of recent scaphoid injury of his R hand - has been out of work where he is employed in a [employer] - he recent was cleared to go back to work but noted that when lifting and bending and twisting he was having L lower back pain - this is non radiating and doesn't go down his leg - no neuro c/o and no urinary c/o, fever or cancer.  OK when holding still.  No meds prior to arrival.  Describes a dull throbbing sensation.  Past Medical History:  Diagnosis Date  . Migraine     There are no active problems to display for this patient.   Past Surgical History:  Procedure Laterality Date  . NO PAST SURGERIES         Home Medications    Prior to Admission medications   Medication Sig Start Date End Date Taking? Authorizing Provider  acetaminophen (TYLENOL) 500 MG tablet Take 1,000 mg by mouth every 6 (six) hours as needed for mild pain.    Yes [provider]  naproxen sodium (ANAPROX) 220 MG tablet Take 220 mg by mouth as needed (mild pain).   Yes [provider]  ibuprofen (ADVIL,MOTRIN) 800 MG tablet Take 1 tablet (800 mg total) by mouth 3 (three) times daily. 12/14/16   Eber Hong, MD  methocarbamol (ROBAXIN) 500 MG tablet Take 1 tablet (500 mg total) by mouth 2 (two) times daily as needed for muscle spasms. 12/14/16   Eber Hong, MD  mupirocin cream (BACTROBAN) 2 % Apply 1 application topically 2 (two) times daily. Patient not taking: Reported on 12/14/2016 08/06/16   Hassan Rowan, MD    Family History History reviewed. No pertinent family history.  Social History Social History  Substance Use Topics  . Smoking status: Current Every Day Smoker    Packs/day: 0.20    Types: Cigarettes    Last attempt to  quit: 10/15/2015  . Smokeless tobacco: Never Used  . Alcohol use Yes     Comment: occasionally     Allergies   Patient has no known allergies.   Review of Systems Review of Systems  Constitutional: Negative for fever.  Musculoskeletal: Positive for back pain.  Skin: Negative for rash.  Neurological: Negative for weakness and numbness.     Physical Exam Updated Vital Signs BP (!) 117/56 (BP Location: Right Arm)   Pulse (!) 50   Temp 98.5 F (36.9 C) (Oral)   Resp 16   SpO2 95%   Physical Exam  Constitutional: He appears well-developed and well-nourished. No distress.  HENT:  Head: Normocephalic and atraumatic.  Mouth/Throat: Oropharynx is clear and moist. No oropharyngeal exudate.  Eyes: Pupils are equal, round, and reactive to light. Conjunctivae and EOM are normal. Right eye exhibits no discharge. Left eye exhibits no discharge. No scleral icterus.  Neck: Normal range of motion. Neck supple. No JVD present. No thyromegaly present.  Cardiovascular: Normal rate, regular rhythm, normal heart sounds and intact distal pulses.  Exam reveals no gallop and no friction rub.   No murmur heard. Pulmonary/Chest: Effort normal and breath sounds normal. No respiratory distress. He has no wheezes. He has no rales.  Musculoskeletal: Normal range of motion.  He exhibits tenderness ( back ttp in the L paraspinal muscles around L1 level - no ttp over teh spine). He exhibits no edema.  Lymphadenopathy:    He has no cervical adenopathy.  Neurological: He is alert. Coordination normal.  Slight antalgic gait Normal strength of bilateral LE's.  Skin: Skin is warm and dry. No rash noted. No erythema.  Psychiatric: He has a normal mood and affect. His behavior is normal.  Nursing note and vitals reviewed.    ED Treatments / Results  Labs (all labs ordered are listed, but only abnormal results are displayed) Labs Reviewed - No data to display   Radiology No results  found.  Procedures Procedures (including critical care time)  Medications Ordered in ED Medications - No data to display   Initial Impression / Assessment and Plan / ED Course  I have reviewed the triage vital signs and the nursing notes.  Pertinent labs & imaging results that were available during my care of the patient were reviewed by me and considered in my medical decision making (see chart for details).     The pt has lumbar strain - has no neuro c/o nsaids Robaxin  Close f/u 2 days out of work.  Final Clinical Impressions(s) / ED Diagnoses   Final diagnoses:  Lumbar strain, initial encounter    New Prescriptions New Prescriptions   IBUPROFEN (ADVIL,MOTRIN) 800 MG TABLET    Take 1 tablet (800 mg total) by mouth 3 (three) times daily.   METHOCARBAMOL (ROBAXIN) 500 MG TABLET    Take 1 tablet (500 mg total) by mouth 2 (two) times daily as needed for muscle spasms.     Eber Hong, MD 12/14/16 [phone]

## 2016-12-14 NOTE — ED Triage Notes (Signed)
States relief with tylenol

## 2016-12-17 ENCOUNTER — Encounter (HOSPITAL_COMMUNITY): Payer: Self-pay

## 2016-12-17 ENCOUNTER — Emergency Department (HOSPITAL_COMMUNITY)
Admission: EM | Admit: 2016-12-17 | Discharge: 2016-12-17 | Disposition: A | Discharge status: Home / Self Care | Payer: 59 | Attending: Emergency Medicine | Admitting: Emergency Medicine

## 2016-12-17 DIAGNOSIS — X500XXA Overexertion from strenuous movement or load, initial encounter: Secondary | ICD-10-CM | POA: Insufficient documentation

## 2016-12-17 DIAGNOSIS — Y929 Unspecified place or not applicable: Secondary | ICD-10-CM | POA: Diagnosis not present

## 2016-12-17 DIAGNOSIS — Y990 Civilian activity done for income or pay: Secondary | ICD-10-CM | POA: Insufficient documentation

## 2016-12-17 DIAGNOSIS — F17210 Nicotine dependence, cigarettes, uncomplicated: Secondary | ICD-10-CM | POA: Insufficient documentation

## 2016-12-17 DIAGNOSIS — S3992XA Unspecified injury of lower back, initial encounter: Secondary | ICD-10-CM | POA: Diagnosis present

## 2016-12-17 DIAGNOSIS — Y9389 Activity, other specified: Secondary | ICD-10-CM | POA: Insufficient documentation

## 2016-12-17 DIAGNOSIS — S39012A Strain of muscle, fascia and tendon of lower back, initial encounter: Secondary | ICD-10-CM | POA: Diagnosis not present

## 2016-12-17 LAB — CBC
HEMATOCRIT: 43.6 % (ref 39.0–52.0)
Hemoglobin: 14.7 g/dL (ref 13.0–17.0)
MCH: 29.2 pg (ref 26.0–34.0)
MCHC: 33.7 g/dL (ref 30.0–36.0)
MCV: 86.5 fL (ref 78.0–100.0)
PLATELETS: 253 10*3/uL (ref 150–400)
RBC: 5.04 MIL/uL (ref 4.22–5.81)
RDW: 13 % (ref 11.5–15.5)
WBC: 5.4 10*3/uL (ref 4.0–10.5)

## 2016-12-17 LAB — BASIC METABOLIC PANEL
Anion gap: 9 (ref 5–15)
BUN: 19 mg/dL (ref 6–20)
CHLORIDE: 104 mmol/L (ref 101–111)
CO2: 23 mmol/L (ref 22–32)
CREATININE: 1.25 mg/dL — AB (ref 0.61–1.24)
Calcium: 8.9 mg/dL (ref 8.9–10.3)
GFR calc non Af Amer: >60 mL/min (ref >60)
Glucose, Bld: 102 mg/dL — ABNORMAL HIGH (ref 65–99)
Potassium: 3.6 mmol/L (ref 3.5–5.1)
Sodium: 136 mmol/L (ref 135–145)

## 2016-12-17 LAB — URINALYSIS, ROUTINE W REFLEX MICROSCOPIC
BILIRUBIN URINE: NEGATIVE
Bacteria, UA: NONE SEEN
Glucose, UA: NEGATIVE mg/dL
HGB URINE DIPSTICK: NEGATIVE
Ketones, ur: NEGATIVE mg/dL
LEUKOCYTES UA: NEGATIVE
NITRITE: NEGATIVE
PH: 6 (ref 5.0–8.0)
Protein, ur: 30 mg/dL — AB
SPECIFIC GRAVITY, URINE: 1.035 — AB (ref 1.005–1.030)
SQUAMOUS EPITHELIAL / LPF: NONE SEEN

## 2016-12-17 MED ORDER — HYDROCODONE-ACETAMINOPHEN 5-325 MG PO TABS: 2.0000 | ORAL_TABLET | ORAL | 0 refills | Status: DC | PRN

## 2016-12-17 MED ORDER — PREDNISONE 10 MG (21) PO TBPK: ORAL_TABLET | Freq: Every day | ORAL | 0 refills | Status: DC

## 2016-12-17 NOTE — ED Notes (Signed)
Pt verbalized understanding of discharge instructions and denies any further questions at this time.   

## 2016-12-17 NOTE — ED Triage Notes (Signed)
Per Pt, Pt is coming from home with complaints of lumbar pain that started a week ago. Pt reports he was seen and told he has back strain. The pain has continued, and the pain now radiates around to the left side of the patient's flank. Denies urinary symptoms or hematuria. Denies N/V

## 2016-12-17 NOTE — ED Provider Notes (Signed)
[facility] Provider Note   CSN: [id number] Arrival date & time: 12/17/16  0730     History   Chief Complaint Chief Complaint  Patient presents with  . Back Pain    HPI Chase Young is a 26 y.o. male.  26 year old  Male who presents with worsening muscle skeletal back pain after lifting heavy object at work.Seen here 3 days ago for similar symptoms and prescribed muscle relaxants as well as anti-inflammatories.Pain is sharp and localized to his leftflank andis positional and does not radiate down to his foot. He denies any foot drop. No bowel or bladder dysfunction. Pain better with rest. Attempted to go back to work but was unable.      Past Medical History:  Diagnosis Date  . Migraine     There are no active problems to display for this patient.   Past Surgical History:  Procedure Laterality Date  . NO PAST SURGERIES         Home Medications    Prior to Admission medications   Medication Sig Start Date End Date Taking? Authorizing Provider  acetaminophen (TYLENOL) 500 MG tablet Take 1,000 mg by mouth every 6 (six) hours as needed for mild pain.     [provider]  ibuprofen (ADVIL,MOTRIN) 800 MG tablet Take 1 tablet (800 mg total) by mouth 3 (three) times daily. 12/14/16   Eber Hong, MD  methocarbamol (ROBAXIN) 500 MG tablet Take 1 tablet (500 mg total) by mouth 2 (two) times daily as needed for muscle spasms. 12/14/16   Eber Hong, MD  mupirocin cream (BACTROBAN) 2 % Apply 1 application topically 2 (two) times daily. Patient not taking: Reported on 12/14/2016 08/06/16   Hassan Rowan, MD  naproxen sodium (ANAPROX) 220 MG tablet Take 220 mg by mouth as needed (mild pain).    [provider]    Family History No family history on file.  Social History Social History  Substance Use Topics  . Smoking status: Current Every Day Smoker    Packs/day: 0.20    Types: Cigarettes    Last attempt to quit: 10/15/2015  . Smokeless  tobacco: Never Used  . Alcohol use Yes     Comment: occasionally     Allergies   Patient has no known allergies.   Review of Systems Review of Systems  All other systems reviewed and are negative.    Physical Exam Updated Vital Signs BP 103/76   Pulse 66   Temp 97.7 F (36.5 C) (Oral)   Resp 16   Ht 1.676 m ( )   Wt 68 kg (150 lb)   SpO2 99%   BMI 24.21 kg/m   Physical Exam  Constitutional: He is oriented to person, place, and time. He appears well-developed and well-nourished.  Non-toxic appearance. No distress.  HENT:  Head: Normocephalic and atraumatic.  Eyes: Pupils are equal, round, and reactive to light. Conjunctivae, EOM and lids are normal.  Neck: Normal range of motion. Neck supple. No tracheal deviation present. No thyroid mass present.  Cardiovascular: Normal rate, regular rhythm and normal heart sounds.  Exam reveals no gallop.   No murmur heard. Pulmonary/Chest: Effort normal and breath sounds normal. No stridor. No respiratory distress. He has no decreased breath sounds. He has no wheezes. He has no rhonchi. He has no rales.  Abdominal: Soft. Normal appearance and bowel sounds are normal. He exhibits no distension. There is no tenderness. There is no rebound and no CVA tenderness.  Musculoskeletal: Normal range  of motion. He exhibits no edema or tenderness.       Back:  Neurological: He is alert and oriented to person, place, and time. He has normal strength. No cranial nerve deficit or sensory deficit. Gait normal. GCS eye subscore is 4. GCS verbal subscore is 5. GCS motor subscore is 6.  Reflex Scores:      Patellar reflexes are 3+ on the right side and 3+ on the left side. Skin: Skin is warm and dry. No abrasion and no rash noted.  Psychiatric: He has a normal mood and affect. His speech is normal and behavior is normal.  Nursing note and vitals reviewed.    ED Treatments / Results  Labs (all labs ordered are listed, but only abnormal results  are displayed) Labs Reviewed  URINALYSIS, ROUTINE W REFLEX MICROSCOPIC - Abnormal; Notable for the following:       Result Value   Specific Gravity, Urine 1.035 (*)    Protein, ur 30 (*)    All other components within normal limits  BASIC METABOLIC PANEL - Abnormal; Notable for the following:    Glucose, Bld 102 (*)    Creatinine, Ser 1.25 (*)    All other components within normal limits  CBC    EKG  EKG Interpretation None       Radiology No results found.  Procedures Procedures (including critical care time)  Medications Ordered in ED Medications - No data to display   Initial Impression / Assessment and Plan / ED Course  I have reviewed the triage vital signs and the nursing notes.  Pertinent labs & imaging results that were available during my care of the patient were reviewed by me and considered in my medical decision making (see chart for details).     Patient without neurological findings here.Has pinpoint tenderness at his left flank without urinary symptoms. No foot drop with walking. Suspect that he has muscle strain. No indication for lumbar imaging. We will prescribe course of prednisone as well as short course of analgesics and return precautions given  Final Clinical Impressions(s) / ED Diagnoses   Final diagnoses:  None    New Prescriptions New Prescriptions   No medications on file     Lorre Nick, MD 12/17/16 1000

## 2016-12-17 NOTE — ED Notes (Signed)
ED Provider at bedside. 
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
## 2018-05-28 ENCOUNTER — Encounter (HOSPITAL_COMMUNITY): Payer: Self-pay

## 2018-05-28 ENCOUNTER — Emergency Department (HOSPITAL_COMMUNITY)
Admission: EM | Admit: 2018-05-28 | Discharge: 2018-05-28 | Disposition: A | Discharge status: Home / Self Care | Payer: 59 | Attending: Emergency Medicine | Admitting: Emergency Medicine

## 2018-05-28 ENCOUNTER — Other Ambulatory Visit: Payer: Self-pay

## 2018-05-28 DIAGNOSIS — F17290 Nicotine dependence, other tobacco product, uncomplicated: Secondary | ICD-10-CM | POA: Insufficient documentation

## 2018-05-28 DIAGNOSIS — R51 Headache: Secondary | ICD-10-CM | POA: Insufficient documentation

## 2018-05-28 DIAGNOSIS — K0889 Other specified disorders of teeth and supporting structures: Secondary | ICD-10-CM | POA: Insufficient documentation

## 2018-05-28 DIAGNOSIS — R519 Headache, unspecified: Secondary | ICD-10-CM

## 2018-05-28 DIAGNOSIS — J029 Acute pharyngitis, unspecified: Secondary | ICD-10-CM | POA: Insufficient documentation

## 2018-05-28 LAB — GROUP A STREP BY PCR: Group A Strep by PCR: NOT DETECTED

## 2018-05-28 MED ORDER — ONDANSETRON 4 MG PO TBDP
4.0000 mg | ORAL_TABLET | Freq: Once | ORAL | Status: AC
  Administered 2018-05-28: 4 mg via ORAL
  Filled 2018-05-28: qty 1

## 2018-05-28 MED ORDER — BENZOCAINE 20 % MT AERO: 1.0000 "application " | INHALATION_SPRAY | Freq: Four times a day (QID) | OROMUCOSAL | 0 refills | Status: AC | PRN

## 2018-05-28 MED ORDER — ACETAMINOPHEN 325 MG PO TABS
650.0000 mg | ORAL_TABLET | Freq: Once | ORAL | Status: AC
  Administered 2018-05-28: 650 mg via ORAL
  Filled 2018-05-28: qty 2

## 2018-05-28 MED ORDER — IBUPROFEN 200 MG PO TABS
600.0000 mg | ORAL_TABLET | Freq: Once | ORAL | Status: AC
  Administered 2018-05-28: 08:00:00 600 mg via ORAL
  Filled 2018-05-28: qty 1

## 2018-05-28 MED ORDER — PENICILLIN V POTASSIUM 500 MG PO TABS: 500.0000 mg | ORAL_TABLET | Freq: Four times a day (QID) | ORAL | 0 refills | Status: AC

## 2018-05-28 NOTE — ED Provider Notes (Signed)
[facility] EMERGENCY DEPARTMENT Provider Note   CSN: [id number] Arrival date & time: 05/28/18  8329    History   Chief Complaint Chief Complaint  Patient presents with  . Sore Throat  . Headache    HPI Chase Young is a 28 y.o. male with history of migraines healthy to emergency department today with chief complaint of headache and sore throat x1 day.  Patient states yesterday while at work his head started to hurt on the right side.  He describes the pain as an ache in his right forehead that radiates down to his neck.  Patient has a history of headaches and states this feels similar.  Patient denies sudden onset and states headache has progressively worsened.  He rates the pain 8 out of 10 in severity.  Patient took Tylenol yesterday and reported symptom improvement. Patient also noticed when eating yesterday the headache intensified.  Patient is reporting pain in his right upper molar.  Is a new problem for him.  He describes the pain as throbbing that is constant.  He admits to associated nausea.  Denies visual changes, head trauma, fever, chills, back pain, abdominal pain.  Pt reports his throat is sore, describing it as feeling raw when he swallows.  The pain does not radiate patient states the pain is constant and worse with eating and drinking.  He rates the pain 6 out of 10 in severity.  Patient tried drinking hot tea with honey prior to arrival.  He states this did not help his sore throat.   History provided by patient    Past Medical History:  Diagnosis Date  . Migraine     There are no active problems to display for this patient.   Past Surgical History:  Procedure Laterality Date  . NO PAST SURGERIES          Home Medications    Prior to Admission medications   Medication Sig Start Date End Date Taking? Authorizing Provider  acetaminophen (TYLENOL) 500 MG tablet Take 1,000 mg by mouth every 6 (six) hours as needed for mild pain.      [provider]  Benzocaine (HURRICAINE) 20 % AERO Use as directed 1 application in the mouth or throat 4 (four) times daily as needed for mouth pain. 05/28/18   Josie Mesa E, PA-C  mupirocin cream (BACTROBAN) 2 % Apply 1 application topically 2 (two) times daily. Patient not taking: Reported on 12/14/2016 08/06/16   Hassan Rowan, MD  naproxen sodium (ANAPROX) 220 MG tablet Take 220 mg by mouth as needed (mild pain).    [provider]  penicillin v potassium (VEETID) 500 MG tablet Take 1 tablet (500 mg total) by mouth 4 (four) times daily for 7 days. 05/28/18 06/04/18  Shalissa Easterwood, Caroleen Hamman, PA-C    Family History History reviewed. No pertinent family history.  Social History Social History   Tobacco Use  . Smoking status: Current Every Day Smoker    Packs/day: 0.20    Types: E-cigarettes    Last attempt to quit: 10/15/2015    Years since quitting: 2.6  . Smokeless tobacco: Never Used  Substance Use Topics  . Alcohol use: Yes    Comment: occasionally  . Drug use: Yes    Types: Marijuana     Allergies   Patient has no known allergies.   Review of Systems Review of Systems  Constitutional: Negative for chills and fever.  HENT: Positive for dental problem and sore throat. Negative  for congestion, rhinorrhea and sinus pressure.   Eyes: Negative for pain and redness.  Respiratory: Negative for cough, shortness of breath and wheezing.   Cardiovascular: Negative for chest pain and palpitations.  Gastrointestinal: Negative for abdominal pain, constipation, diarrhea, nausea and vomiting.  Genitourinary: Negative for dysuria.  Musculoskeletal: Negative for arthralgias, back pain, myalgias and neck pain.  Skin: Negative for rash and wound.  Neurological: Positive for headaches. Negative for dizziness, syncope, speech difficulty, weakness and numbness.  Psychiatric/Behavioral: Negative for confusion.     Physical Exam Updated Vital Signs BP 126/82 (BP Location:  Right Arm)   Pulse 64   Temp 98.3 F (36.8 C) (Oral)   Resp 18   Ht 5\' 6"  (1.676 m)   Wt 72.6 kg   SpO2 97%   BMI 25.82 kg/m   Physical Exam Vitals signs and nursing note reviewed.  Constitutional:      Appearance: He is well-developed. He is not toxic-appearing.  HENT:     Head: Normocephalic and atraumatic.     Mouth/Throat:     Tonsils: Swelling: 1+ on the right. 1+ on the left.      Comments: Minor erythema to oropharynx, no edema, no exudate, no tonsillar swelling, voice normal, neck supple without lymphadenopathy. Dentition appears to be stable. No trismus. Mouth opening to at least 3 finger widths. Handles oral secretions without difficulty Eyes:     General: No scleral icterus.       Right eye: No discharge.        Left eye: No discharge.     Conjunctiva/sclera: Conjunctivae normal.  Neck:     Musculoskeletal: Normal range of motion and neck supple. No neck rigidity.  Cardiovascular:     Rate and Rhythm: Normal rate and regular rhythm.     Pulses: Normal pulses.     Heart sounds: Normal heart sounds.  Pulmonary:     Effort: Pulmonary effort is normal.     Breath sounds: Normal breath sounds.  Abdominal:     General: There is no distension.     Palpations: Abdomen is soft.     Tenderness: There is no abdominal tenderness. There is no guarding.  Musculoskeletal: Normal range of motion.  Skin:    General: Skin is warm and dry.  Neurological:     Mental Status: He is oriented to person, place, and time.     Comments: Speech is clear and goal oriented, follows commands CN III-XII intact, no facial droop Normal strength in upper and lower extremities bilaterally including dorsiflexion and plantar flexion, strong and equal grip strength Sensation normal to light and sharp touch Moves extremities without ataxia, coordination intact Normal finger to nose and rapid alternating movements Normal gait and balance   Psychiatric:        Behavior: Behavior normal.       ED Treatments / Results  Labs (all labs ordered are listed, but only abnormal results are displayed) Labs Reviewed  GROUP A STREP BY PCR    EKG None  Radiology No results found.  Procedures Procedures (including critical care time)  Medications Ordered in ED Medications  acetaminophen (TYLENOL) tablet 650 mg (650 mg Oral Given 05/28/18 0744)  ibuprofen (ADVIL,MOTRIN) tablet 600 mg (600 mg Oral Given 05/28/18 0744)  ondansetron (ZOFRAN-ODT) disintegrating tablet 4 mg (4 mg Oral Given 05/28/18 7342)     Initial Impression / Assessment and Plan / ED Course  I have reviewed the triage vital signs and the nursing notes.  Pertinent labs &  imaging results that were available during my care of the patient were reviewed by me and considered in my medical decision making (see chart for details).  Patient is afebrile, well-appearing.  Presenting with headache and sore throat.  On exam he has a spontaneously draining dental abscess located on right side of his soft palate.  Given that it is already draining we will prescribed penicillin and urged pt follow up with dentist. He does not have chronic dental decay. Exam unconcerning for Ludwig's angina or spread of infection. Suspect patient's headache is related to his dental pain.  Headache was treated with p.o. Tylenol and ibuprofen and improved significantly.  On reevaluation patient states his headache is now a 1 out of 10 in severity.  Presentation is like pts typical HA and non concerning for [facility], ICH, Meningitis, or temporal arteritis. Pt is afebrile with no focal neuro deficits, nuchal rigidity, or change in vision.  Pt is without tonsillar exudate, negative strep. Presents without cervical lymphadenopathy. He does report dysphagia; diagnosis of viral pharyngitis. Discharged with symptomatic tx for pain  Pt does not appear dehydrated, but did discuss importance of water rehydration. Presentation non concerning for PTA or RPA. No trismus  or uvula deviation.  Pt able to drink water in ED without difficulty with intact air way.  Recommend patient follow-up with PCP.  He states he is currently getting insurance through his job but has not started it.  Patient was given information for [facility] clinic for follow-up if needed and recommend he get a PCP once his insurance starts. Specific return precautions discussed. VSS.  Pt appears stable for d/c.   This note was prepared with assistance of Conservation officer, historic buildings. Occasional wrong-word or sound-a-like substitutions may have occurred due to the inherent limitations of voice recognition software.   Final Clinical Impressions(s) / ED Diagnoses   Final diagnoses:  Pain, dental  Acute nonintractable headache, unspecified headache type  Viral pharyngitis    ED Discharge Orders         Ordered    penicillin v potassium (VEETID) 500 MG tablet  4 times daily     05/28/18 0851    Benzocaine (HURRICAINE) 20 % AERO  4 times daily PRN     05/28/18 0851           Sherene Sires, PA-C 05/28/18 0909    Nira Conn, MD 05/29/18 [phone]

## 2018-05-28 NOTE — Discharge Instructions (Signed)
It is important that you follow-up with a dentist.  There is not one listed on call today however we talked about possible ways to get dental follow-up.  You can try calling [facility] and see if they would be able to see you.  You can also call around to local dentist in the area to try to schedule an appointment.  -Prescription has been sent to your pharmacy for penicillin.  This is an antibiotic you can take as prescribed for your dental abscess. -Prescription was also sent for Hurricaine spray.  This can be used for sore throat, please use as.  You can also take Tylenol and ibuprofen for your sore throat.  Please take low doses of Tylenol 325 mg and 200 to 400 mg of ibuprofen.  Take as directed on the bottle.  You have been seen today for headache, dental pain, sore throat.  Please read and follow all provided instructions. Return to the emergency room for worsening condition or new concerning symptoms.    1. Medications: continue usual home medications and prescriptions described above. Take medications as prescribed. Please review all of the medicines and only take them if you do not have an allergy to them.  2. Treatment: rest, drink plenty of fluids 3. Follow Up: Please follow up with your primary doctor in 2-5 days for discussion of your diagnoses and further evaluation after today's visit; Call today to arrange your follow up.  If you do not have a primary care doctor use the resource guide provided to find one;  I have included the number for [facility].  You can call to schedule follow-up appointment if needed.  As discussed you mentioned you have insurance starting soon.  Another way to get a primary care provider is to use your insurance website to find a provider accepting new patients.  It is also a possibility that you have an allergic reaction to any of the medicines that you have been prescribed - Everybody reacts differently to  medications and while MOST people have no trouble with most medicines, you may have a reaction such as nausea, vomiting, rash, swelling, shortness of breath. If this is the case, please stop taking the medicine immediately and contact your physician.  ?

## 2018-05-28 NOTE — ED Notes (Signed)
Patient given discharge instructions and verbalized understanding.  Patient stable to discharge at this time.  Patient is alert and oriented to baseline.  No distressed noted at this time.  All belongings taken with the patient at discharge.   

## 2018-05-28 NOTE — ED Triage Notes (Signed)
Pt presents to ED with Right side headache radiating down to his neck and a sore throat x2 days. Pt reports hx of strep throat and feels symptoms are similar. He endorses pain with swallowing and when eating associated with a "migraine"

## 2022-07-25 ENCOUNTER — Ambulatory Visit
Admission: EM | Admit: 2022-07-25 | Discharge: 2022-07-25 | Disposition: A | Discharge status: Home / Self Care | Payer: Self-pay | Attending: Urgent Care | Admitting: Urgent Care

## 2022-07-25 DIAGNOSIS — H109 Unspecified conjunctivitis: Secondary | ICD-10-CM | POA: Diagnosis not present

## 2022-07-25 DIAGNOSIS — J039 Acute tonsillitis, unspecified: Secondary | ICD-10-CM | POA: Diagnosis not present

## 2022-07-25 LAB — POCT RAPID STREP A (OFFICE): Rapid Strep A Screen: NEGATIVE

## 2022-07-25 MED ORDER — TOBRAMYCIN 0.3 % OP SOLN: 1.0000 [drp] | OPHTHALMIC | 0 refills | Status: AC

## 2022-07-25 MED ORDER — AMOXICILLIN-POT CLAVULANATE 875-125 MG PO TABS: 1.0000 | ORAL_TABLET | Freq: Two times a day (BID) | ORAL | 0 refills | Status: AC

## 2022-07-25 MED ORDER — IBUPROFEN 600 MG PO TABS: 600.0000 mg | ORAL_TABLET | Freq: Four times a day (QID) | ORAL | 0 refills | Status: AC | PRN

## 2022-07-25 NOTE — ED Triage Notes (Signed)
Pt reports sore throat, fever chills x 3 days; redness and drainage in left eye x 1 day "I think someone has a Voodoo doll of me".

## 2022-07-25 NOTE — Discharge Instructions (Signed)
Start amoxicillin-clavulanate for tonsillitis of the right side. Use ibuprofen for pain and inflammation. Start tobramycin eye drops and use 1 week course.

## 2022-07-25 NOTE — ED Provider Notes (Signed)
[facility]  Note:  This document was prepared using Conservation officer, historic buildings and may include unintentional dictation errors.  MRN: [id number] DOB: October 19, 1990  Subjective:   Chase Young is a 32 y.o. male presenting for 3-day history of fever, chills, painful swallowing, throat pain, throat swelling.  Has also started having redness and drainage, left eye pain.  No cough, chest pain, shortness of breath.  Patient is a smoker, does cigarettes and vaping.  No current facility-administered medications for this encounter.  Current Outpatient Medications:    acetaminophen (TYLENOL) 500 MG tablet, Take 1,000 mg by mouth every 6 (six) hours as needed for mild pain. , Disp: , Rfl:    Benzocaine (HURRICAINE) 20 % AERO, Use as directed 1 application in the mouth or throat 4 (four) times daily as needed for mouth pain., Disp: 57 g, Rfl: 0   mupirocin cream (BACTROBAN) 2 %, Apply 1 application topically 2 (two) times daily. (Patient not taking: Reported on 12/14/2016), Disp: 15 g, Rfl: 0   naproxen sodium (ANAPROX) 220 MG tablet, Take 220 mg by mouth as needed (mild pain)., Disp: , Rfl:    No Known Allergies  Past Medical History:  Diagnosis Date   Migraine      Past Surgical History:  Procedure Laterality Date   NO PAST SURGERIES      History reviewed. No pertinent family history.  Social History   Tobacco Use   Smoking status: Every Day    Packs/day: .2    Types: E-cigarettes, Cigarettes    Last attempt to quit: 10/15/2015    Years since quitting: 6.7   Smokeless tobacco: Never  Vaping Use   Vaping Use: Every day  Substance Use Topics   Alcohol use: Yes    Comment: occasionally   Drug use: Yes    Types: Marijuana    ROS   Objective:   Vitals: BP 118/83 (BP Location: Right Arm)   Pulse 67   Temp 98.8 F (37.1 C) (Oral)   Resp 16   SpO2 97%   Physical Exam Constitutional:      General: He is not in acute distress.     Appearance: Normal appearance. He is well-developed and normal weight. He is not ill-appearing, toxic-appearing or diaphoretic.  HENT:     Head: Normocephalic and atraumatic.     Right Ear: Tympanic membrane, ear canal and external ear normal. No drainage, swelling or tenderness. No middle ear effusion. There is no impacted cerumen. Tympanic membrane is not erythematous or bulging.     Left Ear: Tympanic membrane, ear canal and external ear normal. No drainage, swelling or tenderness.  No middle ear effusion. There is no impacted cerumen. Tympanic membrane is not erythematous or bulging.     Nose: Nose normal. No congestion or rhinorrhea.     Mouth/Throat:     Mouth: Mucous membranes are moist.     Pharynx: Pharyngeal swelling (Right-sided tonsil) and posterior oropharyngeal erythema present. No oropharyngeal exudate or uvula swelling.     Tonsils: No tonsillar exudate or tonsillar abscesses. 2+ on the right. 0 on the left.  Eyes:     General: Lids are normal. Lids are everted, no foreign bodies appreciated. No scleral icterus.       Right eye: No foreign body, discharge or hordeolum.        Left eye: Discharge present.No foreign body or hordeolum.     Extraocular Movements: Extraocular movements intact.     Conjunctiva/sclera:  Right eye: Right conjunctiva is not injected. No chemosis, exudate or hemorrhage.    Left eye: Left conjunctiva is injected. No chemosis, exudate or hemorrhage. Cardiovascular:     Rate and Rhythm: Normal rate and regular rhythm.     Heart sounds: Normal heart sounds. No murmur heard.    No friction rub. No gallop.  Pulmonary:     Effort: Pulmonary effort is normal. No respiratory distress.     Breath sounds: Normal breath sounds. No stridor. No wheezing, rhonchi or rales.  Musculoskeletal:     Cervical back: Normal range of motion and neck supple. No rigidity. No muscular tenderness.  Neurological:     General: No focal deficit present.     Mental Status: He  is alert and oriented to person, place, and time.  Psychiatric:        Mood and Affect: Mood normal.        Behavior: Behavior normal.        Thought Content: Thought content normal.        Judgment: Judgment normal.     Results for orders placed or performed during the hospital encounter of 07/25/22 (from the past 24 hour(s))  POCT rapid strep A     Status: None   Collection Time: 07/25/22 11:13 AM  Result Value Ref Range   Rapid Strep A Screen Negative Negative    Assessment and Plan :   PDMP not reviewed this encounter.  1. Acute tonsillitis, unspecified etiology   2. Bacterial conjunctivitis of left eye    Will treat empirically for tonsillitis with Augmentin given his physical exam findings.  Recommend ibuprofen for pain and inflammation.  Use tobramycin for bacterial conjunctivitis of the left eye.  Will defer ER visit for now for workup of a tonsillar abscess, retropharyngeal abscess.  Maintain strict ER precautions as this is a possibility given the substantial swelling and pain that he has.  Counseled patient on potential for adverse effects with medications prescribed/recommended today, ER and return-to-clinic precautions discussed, patient verbalized understanding.    Wallis Bamberg, [place] 07/25/22 1207

## 2022-07-27 ENCOUNTER — Ambulatory Visit
Admission: EM | Admit: 2022-07-27 | Discharge: 2022-07-27 | Disposition: A | Discharge status: Home / Self Care | Payer: BLUE CROSS/BLUE SHIELD | Attending: Nurse Practitioner | Admitting: Nurse Practitioner

## 2022-07-27 DIAGNOSIS — J069 Acute upper respiratory infection, unspecified: Secondary | ICD-10-CM

## 2022-07-27 MED ORDER — PROMETHAZINE-DM 6.25-15 MG/5ML PO SYRP
5.0000 mL | ORAL_SOLUTION | Freq: Four times a day (QID) | ORAL | 0 refills | Status: AC | PRN
Start: 2022-07-27 — End: ?

## 2022-07-27 NOTE — ED Triage Notes (Signed)
Pt reports cough since last night. Report he was not able to seep as he could not stop coughing.

## 2022-07-27 NOTE — ED Provider Notes (Signed)
[facility]    CSN: [id number] Arrival date & time: 07/27/22  1430      History   Chief Complaint Chief Complaint  Patient presents with   Cough    HPI Chase Young is a 32 y.o. male  presents for evaluation of URI symptoms for 1 days. Patient reports associated symptoms of dry cough. Denies N/V/D, fevers, ear pain, body aches, shortness of breath. Patient does not have a hx of asthma.  He states he quit vaping recently.. No known sick contacts.  He was seen in urgent care on 5/11 for sore throat and eye drainage.  He was placed on Augmentin for tonsillitis and tobramycin eyedrops for conjunctivitis.  He reports improvement of the symptoms but developed a cough last night.  Pt has taken nothing OTC for symptoms. Pt has no other concerns at this time.    Cough   Past Medical History:  Diagnosis Date   Migraine     There are no problems to display for this patient.   Past Surgical History:  Procedure Laterality Date   NO PAST SURGERIES         Home Medications    Prior to Admission medications   Medication Sig Start Date End Date Taking? Authorizing Provider  promethazine-dextromethorphan (PROMETHAZINE-DM) 6.25-15 MG/5ML syrup Take 5 mLs by mouth 4 (four) times daily as needed for cough. 07/27/22  Yes Radford Pax, NP  acetaminophen (TYLENOL) 500 MG tablet Take 1,000 mg by mouth every 6 (six) hours as needed for mild pain.     [provider]  amoxicillin-clavulanate (AUGMENTIN) 875-125 MG tablet Take 1 tablet by mouth 2 (two) times daily. 07/25/22   Wallis Bamberg, PA-C  Benzocaine (HURRICAINE) 20 % AERO Use as directed 1 application in the mouth or throat 4 (four) times daily as needed for mouth pain. 05/28/18   Walisiewicz, Yvonna Alanis E, PA-C  ibuprofen (ADVIL) 600 MG tablet Take 1 tablet (600 mg total) by mouth every 6 (six) hours as needed. 07/25/22   Wallis Bamberg, PA-C  mupirocin cream (BACTROBAN) 2 % Apply 1 application topically 2 (two) times  daily. Patient not taking: Reported on 12/14/2016 08/06/16   Hassan Rowan, MD  naproxen sodium (ANAPROX) 220 MG tablet Take 220 mg by mouth as needed (mild pain).    [provider]  tobramycin (TOBREX) 0.3 % ophthalmic solution Place 1 drop into the left eye every 4 (four) hours. 07/25/22   Wallis Bamberg, PA-C    Family History History reviewed. No pertinent family history.  Social History Social History   Tobacco Use   Smoking status: Every Day    Packs/day: .2    Types: E-cigarettes, Cigarettes    Last attempt to quit: 10/15/2015    Years since quitting: 6.7   Smokeless tobacco: Never  Vaping Use   Vaping Use: Every day  Substance Use Topics   Alcohol use: Yes    Comment: occasionally   Drug use: Yes    Types: Marijuana     Allergies   Patient has no known allergies.   Review of Systems Review of Systems  Respiratory:  Positive for cough.      Physical Exam Triage Vital Signs ED Triage Vitals  Enc Vitals Group     BP 07/27/22 1506 120/78     Pulse Rate 07/27/22 1506 75     Resp 07/27/22 1506 18     Temp 07/27/22 1506 99.1 F (37.3 C)     Temp Source 07/27/22  1506 Oral     SpO2 07/27/22 1506 97 %     Weight --      Height --      Head Circumference --      Peak Flow --      Pain Score 07/27/22 1509 0     Pain Loc --      Pain Edu? --      Excl. in GC? --    No data found.  Updated Vital Signs BP 120/78 (BP Location: Right Arm)   Pulse 75   Temp 99.1 F (37.3 C) (Oral)   Resp 18   SpO2 97%   Visual Acuity Right Eye Distance:   Left Eye Distance:   Bilateral Distance:    Right Eye Near:   Left Eye Near:    Bilateral Near:     Physical Exam Vitals and nursing note reviewed.  Constitutional:      General: He is not in acute distress.    Appearance: Normal appearance. He is not ill-appearing or toxic-appearing.  HENT:     Head: Normocephalic and atraumatic.     Right Ear: Tympanic membrane and ear canal normal.     Left Ear:  Tympanic membrane and ear canal normal.     Nose: Congestion present.     Mouth/Throat:     Mouth: Mucous membranes are moist.     Pharynx: Posterior oropharyngeal erythema present.  Eyes:     Pupils: Pupils are equal, round, and reactive to light.  Cardiovascular:     Rate and Rhythm: Normal rate and regular rhythm.     Heart sounds: Normal heart sounds.  Pulmonary:     Effort: Pulmonary effort is normal.     Breath sounds: Normal breath sounds.  Musculoskeletal:     Cervical back: Normal range of motion and neck supple.  Lymphadenopathy:     Cervical: No cervical adenopathy.  Skin:    General: Skin is warm and dry.  Neurological:     General: No focal deficit present.     Mental Status: He is alert and oriented to person, place, and time.  Psychiatric:        Mood and Affect: Mood normal.        Behavior: Behavior normal.      UC Treatments / Results  Labs (all labs ordered are listed, but only abnormal results are displayed) Labs Reviewed - No data to display  EKG   Radiology No results found.  Procedures Procedures (including critical care time)  Medications Ordered in UC Medications - No data to display  Initial Impression / Assessment and Plan / UC Course  I have reviewed the triage vital signs and the nursing notes.  Pertinent labs & imaging results that were available during my care of the patient were reviewed by me and considered in my medical decision making (see chart for details).     Reviewed exam and sx with patient. No red flags Start promethazine DM for cough  Continue augmentin and antibiotic eye drops as previously prescribed  PCP follow up if symptoms do not improve ER precautions reviewed and pt verbalized understanding  Final Clinical Impressions(s) / UC Diagnoses   Final diagnoses:  Viral upper respiratory infection     Discharge Instructions      Promethazine DM as needed for cough. Please note this medication can make you  drowsy. Do not drink while on this medication or drive Continue the antibiotics that were already prescribed for you  Please follow up with your PCP if your symptoms do not improve Please go to the ER for any worsening symptoms  I hope you feel better soon!   ED Prescriptions     Medication Sig Dispense Auth. Provider   promethazine-dextromethorphan (PROMETHAZINE-DM) 6.25-15 MG/5ML syrup Take 5 mLs by mouth 4 (four) times daily as needed for cough. 118 mL Radford Pax, NP      PDMP not reviewed this encounter.   Radford Pax, NP 07/27/22 [phone]

## 2022-07-27 NOTE — Discharge Instructions (Signed)
Promethazine DM as needed for cough. Please note this medication can make you drowsy. Do not drink while on this medication or drive Continue the antibiotics that were already prescribed for you  Please follow up with your PCP if your symptoms do not improve Please go to the ER for any worsening symptoms  I hope you feel better soon!
